# Patient Record
Sex: Male | Born: 1970 | Race: Black or African American | Hispanic: No | Marital: Married | Smoking: Former smoker
Health system: Southern US, Community
[De-identification: ages and names within clinical notes are randomized; demographics above are authoritative.]

## PROBLEM LIST (undated history)

## (undated) DIAGNOSIS — K219 Gastro-esophageal reflux disease without esophagitis: Secondary | ICD-10-CM

## (undated) DIAGNOSIS — T7840XA Allergy, unspecified, initial encounter: Secondary | ICD-10-CM

## (undated) HISTORY — DX: Allergy, unspecified, initial encounter: T78.40XA

## (undated) HISTORY — PX: KNEE SURGERY: SHX244

## (undated) HISTORY — DX: Gastro-esophageal reflux disease without esophagitis: K21.9

---

## 2003-01-21 ENCOUNTER — Emergency Department (HOSPITAL_COMMUNITY): Admission: EM | Admit: 2003-01-21 | Discharge: 2003-01-21 | Payer: Self-pay | Admitting: Emergency Medicine

## 2003-01-21 ENCOUNTER — Encounter: Payer: Self-pay | Admitting: Emergency Medicine

## 2010-05-21 ENCOUNTER — Emergency Department (HOSPITAL_BASED_OUTPATIENT_CLINIC_OR_DEPARTMENT_OTHER): Admission: EM | Admit: 2010-05-21 | Discharge: 2010-05-21 | Payer: Self-pay | Admitting: Emergency Medicine

## 2011-04-24 ENCOUNTER — Emergency Department (HOSPITAL_BASED_OUTPATIENT_CLINIC_OR_DEPARTMENT_OTHER)
Admission: EM | Admit: 2011-04-24 | Discharge: 2011-04-24 | Disposition: A | Discharge status: Home / Self Care | Payer: Self-pay | Attending: Emergency Medicine | Admitting: Emergency Medicine

## 2011-04-24 DIAGNOSIS — H60399 Other infective otitis externa, unspecified ear: Secondary | ICD-10-CM | POA: Insufficient documentation

## 2011-04-24 DIAGNOSIS — J329 Chronic sinusitis, unspecified: Secondary | ICD-10-CM | POA: Insufficient documentation

## 2011-09-01 ENCOUNTER — Emergency Department (HOSPITAL_BASED_OUTPATIENT_CLINIC_OR_DEPARTMENT_OTHER)
Admission: EM | Admit: 2011-09-01 | Discharge: 2011-09-01 | Disposition: A | Discharge status: Home / Self Care | Payer: Self-pay | Attending: Emergency Medicine | Admitting: Emergency Medicine

## 2011-09-01 DIAGNOSIS — F17200 Nicotine dependence, unspecified, uncomplicated: Secondary | ICD-10-CM | POA: Insufficient documentation

## 2011-09-01 DIAGNOSIS — R51 Headache: Secondary | ICD-10-CM | POA: Insufficient documentation

## 2011-09-01 DIAGNOSIS — K029 Dental caries, unspecified: Secondary | ICD-10-CM | POA: Insufficient documentation

## 2011-09-01 MED ORDER — PENICILLIN V POTASSIUM 500 MG PO TABS: 500.0000 mg | ORAL_TABLET | Freq: Four times a day (QID) | ORAL | Status: AC

## 2011-09-01 MED ORDER — HYDROCODONE-ACETAMINOPHEN 5-500 MG PO TABS: 1.0000 | ORAL_TABLET | Freq: Four times a day (QID) | ORAL | Status: AC | PRN

## 2011-09-01 NOTE — ED Provider Notes (Signed)
History     CSN: [id number] Arrival date & time: 09/01/2011  4:49 PM   First MD Initiated Contact with Patient 09/01/11 1720      Chief Complaint  Patient presents with  . Facial Pain    (Consider location/radiation/quality/duration/timing/severity/associated sxs/prior treatment) HPI Comments: Pt had started about one week ago with left side h/a and sinus pain.  Has been taking OTC cold meds, that is better, now with pain to left upper tooth radiatiing to left ear.  No headache now, no neck pain, no sinus pain, no dizziness, no fever  Patient is a 40 y.o. male presenting with tooth pain.  Dental PainPrimary symptoms do not include dental injury, headaches, fever, shortness of breath, sore throat or cough. The symptoms began 2 days ago. The symptoms are worsening. The symptoms are new. The symptoms occur constantly.  Additional symptoms include: gum tenderness and ear pain. Additional symptoms do not include: gum swelling, trismus, facial swelling, trouble swallowing, drooling and fatigue.    History reviewed. No pertinent past medical history.  Past Surgical History  Procedure Date  . Knee surgery     History reviewed. No pertinent family history.  History  Substance Use Topics  . Smoking status: Current Some Day Smoker  . Smokeless tobacco: Not on file  . Alcohol Use: Yes      Review of Systems  Constitutional: Negative for fever, chills, diaphoresis and fatigue.  HENT: Positive for ear pain, congestion and dental problem. Negative for sore throat, facial swelling, rhinorrhea, sneezing, drooling and trouble swallowing.   Eyes: Negative.   Respiratory: Negative for cough, chest tightness and shortness of breath.   Cardiovascular: Negative for chest pain and leg swelling.  Gastrointestinal: Negative for nausea, vomiting, abdominal pain, diarrhea and blood in stool.  Genitourinary: Negative for frequency, hematuria, flank pain and difficulty urinating.  Musculoskeletal:  Negative for back pain and arthralgias.  Skin: Negative for rash.  Neurological: Negative for dizziness, speech difficulty, weakness, numbness and headaches.    Allergies  Codeine  Home Medications   Current Outpatient Rx  Name Route Sig Dispense Refill  . DEXTROMETHORPHAN-GUAIFENESIN 30-600 MG PO TB12 Oral Take 1 tablet by mouth every 12 (twelve) hours.      . OMEGA-3 FATTY ACIDS 1000 MG PO CAPS Oral Take 1 g by mouth daily.      Marland Kitchen VISINE OP Both Eyes Place 1 drop into both eyes daily as needed. Dry eyes     . VITAMIN C 500 MG PO TABS Oral Take 500 mg by mouth daily.      Marland Kitchen HYDROCODONE-ACETAMINOPHEN 5-500 MG PO TABS Oral Take 1-2 tablets by mouth every 6 (six) hours as needed for pain. 15 tablet 0  . PENICILLIN V POTASSIUM 500 MG PO TABS Oral Take 1 tablet (500 mg total) by mouth 4 (four) times daily. 40 tablet 0    BP 116/71  Pulse 81  Temp(Src) 98.4 F (36.9 C) (Oral)  Resp 16  Ht 5\' 9"  (1.753 m)  Wt 175 lb (79.379 kg)  BMI 25.84 kg/m2  SpO2 99%  Physical Exam  Constitutional: He is oriented to person, place, and time. He appears well-developed and well-nourished.  HENT:  Head: Normocephalic and atraumatic.       No pain over sinuses, ears normal.  No trismus or elevation of tongue.  Has point tenderness over left upper 1st molar.  No abscess noted  Eyes: Pupils are equal, round, and reactive to light.  Neck: Normal range of motion. Neck  supple.  Cardiovascular: Normal rate, regular rhythm and normal heart sounds.   Pulmonary/Chest: Effort normal and breath sounds normal. No respiratory distress. He has no wheezes. He has no rales. He exhibits no tenderness.  Abdominal: Soft. Bowel sounds are normal. There is no tenderness. There is no rebound and no guarding.  Musculoskeletal: Normal range of motion. He exhibits no edema.  Lymphadenopathy:    He has no cervical adenopathy.  Neurological: He is alert and oriented to person, place, and time.  Skin: Skin is warm and dry.  No rash noted.  Psychiatric: He has a normal mood and affect.    ED Course  Procedures (including critical care time)  Labs Reviewed - No data to display No results found.   1. Dental caries       MDM  No evidence of dental abscess.  Pain is localized to tooth, doubt sinus infection or other intracranial pathology.  Advised pt to f/u with dentist, return for any worsening symptoms        Rolan Bucco, MD 09/01/11 1756

## 2011-09-01 NOTE — ED Notes (Signed)
Sinus pain, left ear pain, toothache

## 2013-01-05 ENCOUNTER — Emergency Department (HOSPITAL_BASED_OUTPATIENT_CLINIC_OR_DEPARTMENT_OTHER)
Admission: EM | Admit: 2013-01-05 | Discharge: 2013-01-05 | Disposition: A | Discharge status: Home / Self Care | Payer: Self-pay | Attending: Emergency Medicine | Admitting: Emergency Medicine

## 2013-01-05 ENCOUNTER — Encounter (HOSPITAL_BASED_OUTPATIENT_CLINIC_OR_DEPARTMENT_OTHER): Payer: Self-pay | Admitting: *Deleted

## 2013-01-05 DIAGNOSIS — K0889 Other specified disorders of teeth and supporting structures: Secondary | ICD-10-CM

## 2013-01-05 DIAGNOSIS — F17200 Nicotine dependence, unspecified, uncomplicated: Secondary | ICD-10-CM | POA: Insufficient documentation

## 2013-01-05 DIAGNOSIS — R220 Localized swelling, mass and lump, head: Secondary | ICD-10-CM | POA: Insufficient documentation

## 2013-01-05 DIAGNOSIS — K089 Disorder of teeth and supporting structures, unspecified: Secondary | ICD-10-CM | POA: Insufficient documentation

## 2013-01-05 DIAGNOSIS — Z79899 Other long term (current) drug therapy: Secondary | ICD-10-CM | POA: Insufficient documentation

## 2013-01-05 MED ORDER — HYDROCODONE-ACETAMINOPHEN 5-325 MG PO TABS: 2.0000 | ORAL_TABLET | Freq: Four times a day (QID) | ORAL | Status: DC | PRN

## 2013-01-05 MED ORDER — PENICILLIN V POTASSIUM 500 MG PO TABS: 500.0000 mg | ORAL_TABLET | Freq: Three times a day (TID) | ORAL | Status: DC

## 2013-01-05 NOTE — ED Notes (Signed)
Dental abcess top right jaw

## 2013-01-05 NOTE — ED Provider Notes (Signed)
History     CSN: [id number]  Arrival date & time 01/05/13  4098   First MD Initiated Contact with Patient 01/05/13 [phone]      Chief Complaint  Patient presents with  . Dental Pain    (Consider location/radiation/quality/duration/timing/severity/associated sxs/prior treatment) Patient is a 42 y.o. male presenting with tooth pain. The history is provided by the patient.  Dental PainThe primary symptoms include mouth pain. Primary symptoms do not include dental injury. Episode onset: 3-4 days ago. The symptoms are worsening. The symptoms are new. The symptoms occur constantly.  Additional symptoms include: dental sensitivity to temperature, gum swelling and gum tenderness.    History reviewed. No pertinent past medical history.  Past Surgical History  Procedure Laterality Date  . Knee surgery      No family history on file.  History  Substance Use Topics  . Smoking status: Current Some Day Smoker  . Smokeless tobacco: Not on file  . Alcohol Use: Yes      Review of Systems  All other systems reviewed and are negative.    Allergies  Codeine  Home Medications   Current Outpatient Rx  Name  Route  Sig  Dispense  Refill  . dextromethorphan-guaiFENesin (MUCINEX DM) 30-600 MG per 12 hr tablet   Oral   Take 1 tablet by mouth every 12 (twelve) hours.           . fish oil-omega-3 fatty acids 1000 MG capsule   Oral   Take 1 g by mouth daily.           . Tetrahydrozoline HCl (VISINE OP)   Both Eyes   Place 1 drop into both eyes daily as needed. Dry eyes          . vitamin C (ASCORBIC ACID) 500 MG tablet   Oral   Take 500 mg by mouth daily.             BP 113/73  Pulse 70  Temp(Src) 98.4 F (36.9 C) (Oral)  Resp 15  Ht 5\' 10"  (1.778 m)  Wt 175 lb (79.379 kg)  BMI 25.11 kg/m2  SpO2 96%  Physical Exam  Nursing note and vitals reviewed. Constitutional: He is oriented to person, place, and time. He appears well-developed and well-nourished. No  distress.  HENT:  Head: Normocephalic and atraumatic.  Mouth/Throat: Oropharynx is clear and moist.  The right upper molars are noted to have multiple caries and ttp.  There is some gingival inflammation but no obvious abscess.  Neck: Normal range of motion. Neck supple.  Musculoskeletal: Normal range of motion.  Lymphadenopathy:    He has no cervical adenopathy.  Neurological: He is alert and oriented to person, place, and time.  Skin: Skin is warm and dry. He is not diaphoretic.    ED Course  Procedures (including critical care time)  Labs Reviewed - No data to display No results found.   No diagnosis found.    MDM  Antibiotics, pain meds, follow up with dentist.        Geoffery Lyons, MD 01/05/13 [phone]

## 2013-03-24 ENCOUNTER — Emergency Department (HOSPITAL_BASED_OUTPATIENT_CLINIC_OR_DEPARTMENT_OTHER)
Admission: EM | Admit: 2013-03-24 | Discharge: 2013-03-24 | Disposition: A | Discharge status: Home / Self Care | Payer: Self-pay | Attending: Emergency Medicine | Admitting: Emergency Medicine

## 2013-03-24 ENCOUNTER — Encounter (HOSPITAL_BASED_OUTPATIENT_CLINIC_OR_DEPARTMENT_OTHER): Payer: Self-pay | Admitting: *Deleted

## 2013-03-24 DIAGNOSIS — H9209 Otalgia, unspecified ear: Secondary | ICD-10-CM | POA: Insufficient documentation

## 2013-03-24 DIAGNOSIS — R05 Cough: Secondary | ICD-10-CM | POA: Insufficient documentation

## 2013-03-24 DIAGNOSIS — R059 Cough, unspecified: Secondary | ICD-10-CM | POA: Insufficient documentation

## 2013-03-24 DIAGNOSIS — J069 Acute upper respiratory infection, unspecified: Secondary | ICD-10-CM | POA: Insufficient documentation

## 2013-03-24 DIAGNOSIS — J029 Acute pharyngitis, unspecified: Secondary | ICD-10-CM | POA: Insufficient documentation

## 2013-03-24 DIAGNOSIS — J309 Allergic rhinitis, unspecified: Secondary | ICD-10-CM | POA: Insufficient documentation

## 2013-03-24 MED ORDER — CETIRIZINE-PSEUDOEPHEDRINE ER 5-120 MG PO TB12: 1.0000 | ORAL_TABLET | Freq: Two times a day (BID) | ORAL | Status: DC

## 2013-03-24 NOTE — ED Notes (Signed)
Patient states he has a four day history of sinus congestion, sore throat, productive cough with reddish secretions and headache, symptoms are associated with chills and sweating.

## 2013-03-24 NOTE — ED Provider Notes (Signed)
History     CSN: [id number]  Arrival date & time 03/24/13  0865   First MD Initiated Contact with Patient 03/24/13 [phone]      Chief Complaint  Patient presents with  . URI    (Consider location/radiation/quality/duration/timing/severity/associated sxs/prior treatment) HPI Comments: Patient with history of seasonal allergies.  4 day history of sinus congestion, drainage.  She feels as though these are his allergies.  Has been on zyrtec in the past but not improving.  Patient is a 42 y.o. male presenting with URI. The history is provided by the patient.  URI Presenting symptoms: congestion, cough, ear pain, rhinorrhea and sore throat   Severity:  Moderate Onset quality:  Gradual Duration:  4 days Timing:  Constant Progression:  Worsening Chronicity:  New Relieved by:  Nothing Worsened by:  Nothing tried Ineffective treatments:  None tried   History reviewed. No pertinent past medical history.  Past Surgical History  Procedure Laterality Date  . Knee surgery      No family history on file.  History  Substance Use Topics  . Smoking status: Never Smoker   . Smokeless tobacco: Not on file  . Alcohol Use: No      Review of Systems  HENT: Positive for ear pain, congestion, sore throat and rhinorrhea.   Respiratory: Positive for cough.   All other systems reviewed and are negative.    Allergies  Codeine  Home Medications   Current Outpatient Rx  Name  Route  Sig  Dispense  Refill  . dextromethorphan-guaiFENesin (MUCINEX DM) 30-600 MG per 12 hr tablet   Oral   Take 1 tablet by mouth every 12 (twelve) hours.           . fish oil-omega-3 fatty acids 1000 MG capsule   Oral   Take 1 g by mouth daily.           Marland Kitchen HYDROcodone-acetaminophen (NORCO/VICODIN) 5-325 MG per tablet   Oral   Take 2 tablets by mouth every 6 (six) hours as needed for pain.   20 tablet   0   . penicillin v potassium (VEETID) 500 MG tablet   Oral   Take 1 tablet (500 mg total) by  mouth 3 (three) times daily.   30 tablet   0   . Tetrahydrozoline HCl (VISINE OP)   Both Eyes   Place 1 drop into both eyes daily as needed. Dry eyes          . vitamin C (ASCORBIC ACID) 500 MG tablet   Oral   Take 500 mg by mouth daily.             BP 127/88  Pulse 71  Temp(Src) 98.5 F (36.9 C) (Oral)  Resp 16  SpO2 100%  Physical Exam  Nursing note reviewed. Constitutional: He is oriented to person, place, and time. He appears well-developed and well-nourished. No distress.  HENT:  Head: Normocephalic and atraumatic.  Mouth/Throat: Oropharynx is clear and moist.  TM's clear bilaterally.  Neck: Normal range of motion. Neck supple.  Cardiovascular: Normal rate and regular rhythm.   No murmur heard. Pulmonary/Chest: Effort normal and breath sounds normal. No respiratory distress.  Abdominal: Soft. Bowel sounds are normal.  Musculoskeletal: Normal range of motion. He exhibits no edema.  Neurological: He is alert and oriented to person, place, and time.  Skin: Skin is warm and dry. He is not diaphoretic.    ED Course  Procedures (including critical care time)  Labs Reviewed -  No data to display No results found.   No diagnosis found.    MDM  Will treat with zyrtec, time.  Return prn.  Doubt an antibiotic will help as symptoms only present for four days.        Geoffery Lyons, MD 03/24/13 [phone]

## 2013-08-05 ENCOUNTER — Emergency Department (HOSPITAL_BASED_OUTPATIENT_CLINIC_OR_DEPARTMENT_OTHER)
Admission: EM | Admit: 2013-08-05 | Discharge: 2013-08-05 | Disposition: A | Discharge status: Home / Self Care | Payer: Self-pay | Attending: Emergency Medicine | Admitting: Emergency Medicine

## 2013-08-05 ENCOUNTER — Emergency Department (HOSPITAL_BASED_OUTPATIENT_CLINIC_OR_DEPARTMENT_OTHER): Payer: Self-pay

## 2013-08-05 DIAGNOSIS — Z79899 Other long term (current) drug therapy: Secondary | ICD-10-CM | POA: Insufficient documentation

## 2013-08-05 DIAGNOSIS — IMO0001 Reserved for inherently not codable concepts without codable children: Secondary | ICD-10-CM | POA: Insufficient documentation

## 2013-08-05 DIAGNOSIS — Z9889 Other specified postprocedural states: Secondary | ICD-10-CM | POA: Insufficient documentation

## 2013-08-05 DIAGNOSIS — M25569 Pain in unspecified knee: Secondary | ICD-10-CM | POA: Insufficient documentation

## 2013-08-05 DIAGNOSIS — M25561 Pain in right knee: Secondary | ICD-10-CM

## 2013-08-05 MED ORDER — HYDROCODONE-ACETAMINOPHEN 5-325 MG PO TABS: 2.0000 | ORAL_TABLET | ORAL | Status: DC | PRN

## 2013-08-05 MED ORDER — IBUPROFEN 800 MG PO TABS: 800.0000 mg | ORAL_TABLET | Freq: Three times a day (TID) | ORAL | Status: DC

## 2013-08-05 NOTE — ED Notes (Signed)
MD at bedside. 

## 2013-08-05 NOTE — ED Provider Notes (Signed)
CSN: [id number]     Arrival date & time 08/05/13  0865 History   First MD Initiated Contact with Patient 08/05/13 0913     Chief Complaint  Patient presents with  . Knee Pain   (Consider location/radiation/quality/duration/timing/severity/associated sxs/prior Treatment) HPI Comments: Patient complains of constant right-sided knee pain for the past 2 weeks. He denies any injury. He works in a [employer], quantity a lot of boxes. He had surgery on his knee in 2006 for patella fracture. He denies having problems since then. Denies any fevers or vomiting. Denies any focal weakness, numbness or tingling. The pain is diffuse in the knee and does not radiate. It is constant. It is worse with weightbearing and movement. It is better with rest. He took some Tylenol with partial relief.  The history is provided by the patient.    No past medical history on file. Past Surgical History  Procedure Laterality Date  . Knee surgery     No family history on file. History  Substance Use Topics  . Smoking status: Never Smoker   . Smokeless tobacco: Not on file  . Alcohol Use: No    Review of Systems  Constitutional: Negative for fever, activity change and appetite change.  HENT: Negative for rhinorrhea.   Respiratory: Negative for cough, chest tightness and shortness of breath.   Gastrointestinal: Negative for nausea, vomiting and abdominal pain.  Genitourinary: Negative for dysuria and hematuria.  Musculoskeletal: Positive for myalgias and arthralgias. Negative for joint swelling.  Skin: Negative for wound.  Neurological: Negative for dizziness, weakness and headaches.  A complete 10 system review of systems was obtained and all systems are negative except as noted in the HPI and PMH.    Allergies  Codeine  Home Medications   Current Outpatient Rx  Name  Route  Sig  Dispense  Refill  . cetirizine-pseudoephedrine (ZYRTEC-D) 5-120 MG per tablet   Oral   Take 1 tablet by mouth 2 (two) times  daily.   60 tablet   0   . HYDROcodone-acetaminophen (NORCO/VICODIN) 5-325 MG per tablet   Oral   Take 2 tablets by mouth every 4 (four) hours as needed for pain.   10 tablet   0   . ibuprofen (ADVIL,MOTRIN) 800 MG tablet   Oral   Take 1 tablet (800 mg total) by mouth 3 (three) times daily.   21 tablet   0    BP 113/62  Pulse 78  Temp(Src) 97.9 F (36.6 C) (Oral)  Resp 16  Ht 5\' 9"  (1.753 m)  Wt 175 lb (79.379 kg)  BMI 25.83 kg/m2  SpO2 100% Physical Exam  Constitutional: He is oriented to person, place, and time. He appears well-developed and well-nourished. No distress.  HENT:  Head: Normocephalic and atraumatic.  Mouth/Throat: Oropharynx is clear and moist. No oropharyngeal exudate.  Eyes: Conjunctivae and EOM are normal. Pupils are equal, round, and reactive to light.  Neck: Normal range of motion. Neck supple.  Cardiovascular: Normal rate, regular rhythm and normal heart sounds.   No murmur heard. Pulmonary/Chest: Effort normal and breath sounds normal. No respiratory distress.  Abdominal: Soft. There is no tenderness. There is no rebound and no guarding.  Musculoskeletal: Normal range of motion. He exhibits no edema.  R knee has a well-healed surgical scar. There is full range of motion without pain. +2 DP and PT pulse. No calf tenderness. Pain to Palpation of patella and lateral knee. No ligament laxity.  Neurological: He is alert and oriented  to person, place, and time. No cranial nerve deficit. He exhibits normal muscle tone. Coordination normal.  Normal gait  Skin: Skin is warm. No erythema.    ED Course  Procedures (including critical care time) Labs Review Labs Reviewed - No data to display Imaging Review Dg Knee 1-2 Views Right  08/05/2013   CLINICAL DATA:  Pain  EXAM: RIGHT KNEE - 1-2 VIEW  COMPARISON:  None.  FINDINGS: Frontal and lateral views were obtained. There is postoperative change in the patella. There is no acute fracture, dislocation, or  effusion. Joint spaces appear intact. No erosive change.  IMPRESSION: Postoperative change in the patella. No fracture, effusion, or appreciable arthropathy.   Electronically Signed   By: Bretta Bang   On: 08/05/2013 09:44    MDM   1. Knee pain, right    2 weeks of atraumatic knee pain, previous surgery. Vital stable. No distress.  No evidence of septic joint. We'll obtain x-ray to evaluate for hardware failure.  X-rays negative for acute pathology. Patient instructed on anti-inflammatory and Rice therapy. Followup with his orthopedic surgeon. Return precautions discussed.  Glynn Octave, MD 08/05/13 [phone]

## 2013-08-05 NOTE — ED Notes (Signed)
Right knee pain x 2 weeks unrelieved after taking OTC pain medication. Denies injury.

## 2013-08-12 ENCOUNTER — Encounter (HOSPITAL_BASED_OUTPATIENT_CLINIC_OR_DEPARTMENT_OTHER): Payer: Self-pay | Admitting: *Deleted

## 2013-08-12 ENCOUNTER — Emergency Department (HOSPITAL_BASED_OUTPATIENT_CLINIC_OR_DEPARTMENT_OTHER)
Admission: EM | Admit: 2013-08-12 | Discharge: 2013-08-12 | Disposition: A | Discharge status: Home / Self Care | Payer: Self-pay | Attending: Emergency Medicine | Admitting: Emergency Medicine

## 2013-08-12 DIAGNOSIS — Z9889 Other specified postprocedural states: Secondary | ICD-10-CM | POA: Insufficient documentation

## 2013-08-12 DIAGNOSIS — Z791 Long term (current) use of non-steroidal anti-inflammatories (NSAID): Secondary | ICD-10-CM | POA: Insufficient documentation

## 2013-08-12 DIAGNOSIS — M25561 Pain in right knee: Secondary | ICD-10-CM

## 2013-08-12 DIAGNOSIS — Z79899 Other long term (current) drug therapy: Secondary | ICD-10-CM | POA: Insufficient documentation

## 2013-08-12 DIAGNOSIS — M25569 Pain in unspecified knee: Secondary | ICD-10-CM | POA: Insufficient documentation

## 2013-08-12 NOTE — ED Notes (Signed)
Right knee pain x 3 weeks- seen recently for same

## 2013-08-12 NOTE — ED Notes (Signed)
D/c home- worknote given per EDP Delo

## 2013-08-12 NOTE — ED Notes (Signed)
MD at bedside. 

## 2013-08-12 NOTE — ED Provider Notes (Signed)
CSN: [id number]     Arrival date & time 08/12/13  0520 History   First MD Initiated Contact with Patient 08/12/13 0534     Chief Complaint  Patient presents with  . Leg Pain   (Consider location/radiation/quality/duration/timing/severity/associated sxs/prior Treatment) HPI Comments: Patient is a 42 year old male with history of fractured patella 10 years ago which was fixed surgically by an orthopedist in [facility]. Has been doing okay up until the past several weeks when his knee started to hurt again. He states that he has been on his feet at work for extended periods of time but denies any new injury or trauma. He was seen one week ago and had x-rays performed which did not reveal any acute abnormality. He returns today stating that he continues to have discomfort and is not improving.  Patient is a 42 y.o. male presenting with leg pain. The history is provided by the patient.  Leg Pain Location:  Knee Time since incident:  3 weeks Injury: no   Knee location:  R knee Pain details:    Quality:  Aching   Radiates to:  Does not radiate   Severity:  Moderate   Onset quality:  Gradual   Duration:  3 weeks   Timing:  Constant   Progression:  Worsening Chronicity:  New Prior injury to area:  Yes Relieved by:  Nothing Worsened by:  Activity and bearing weight   History reviewed. No pertinent past medical history. Past Surgical History  Procedure Laterality Date  . Knee surgery     No family history on file. History  Substance Use Topics  . Smoking status: Never Smoker   . Smokeless tobacco: Never Used  . Alcohol Use: No    Review of Systems  All other systems reviewed and are negative.    Allergies  Codeine  Home Medications   Current Outpatient Rx  Name  Route  Sig  Dispense  Refill  . cetirizine-pseudoephedrine (ZYRTEC-D) 5-120 MG per tablet   Oral   Take 1 tablet by mouth 2 (two) times daily.   60 tablet   0   . HYDROcodone-acetaminophen (NORCO/VICODIN)  5-325 MG per tablet   Oral   Take 2 tablets by mouth every 4 (four) hours as needed for pain.   10 tablet   0   . ibuprofen (ADVIL,MOTRIN) 800 MG tablet   Oral   Take 1 tablet (800 mg total) by mouth 3 (three) times daily.   21 tablet   0   . HYDROcodone-acetaminophen (NORCO/VICODIN) 5-325 MG per tablet   Oral   Take 2 tablets by mouth every 4 (four) hours as needed for pain.   10 tablet   0   . ibuprofen (ADVIL,MOTRIN) 800 MG tablet   Oral   Take 1 tablet (800 mg total) by mouth 3 (three) times daily.   21 tablet   0    BP 114/61  Pulse 72  Temp(Src) 98.1 F (36.7 C) (Oral)  Resp 16  Ht 5\' 10"  (1.778 m)  Wt 175 lb (79.379 kg)  BMI 25.11 kg/m2  SpO2 98% Physical Exam  Nursing note and vitals reviewed. Constitutional: He is oriented to person, place, and time. He appears well-developed and well-nourished. No distress.  HENT:  Head: Normocephalic and atraumatic.  Neck: Normal range of motion. Neck supple.  Musculoskeletal: Normal range of motion. He exhibits no edema.  The right main is noted to have a prior surgical incision. This appears to be well-healed. There is tenderness  to palpation over the patella and just inferior to the patella. There is no swelling and no effusion. He has good range of motion however there is some discomfort with extension. Anterior and posterior drawer tests are negative and there is no laxity to varus or valgus stress. Distal pulses motor and sensory are intact.  Neurological: He is alert and oriented to person, place, and time.  Skin: Skin is warm and dry. He is not diaphoretic.    ED Course  Procedures (including critical care time) Labs Review Labs Reviewed - No data to display Imaging Review No results found.  MDM  No diagnosis found. I've advised the patient to call his orthopedic surgeon to arrange a followup appointment to discuss why he is having this discomfort. X-rays already been performed and I don't feel the need to  perform these again as there has been no trauma in the interim. I will advise him to continue his anti-inflammatory medications and arrange his followup appointment this morning. I see no redness, effusion or warmth that would suggest a septic joint and see no indication for an arthrocentesis.    Geoffery Lyons, MD 08/12/13 [phone]

## 2014-02-10 ENCOUNTER — Emergency Department (HOSPITAL_BASED_OUTPATIENT_CLINIC_OR_DEPARTMENT_OTHER)
Admission: EM | Admit: 2014-02-10 | Discharge: 2014-02-10 | Disposition: A | Discharge status: Home / Self Care | Payer: Self-pay | Attending: Emergency Medicine | Admitting: Emergency Medicine

## 2014-02-10 ENCOUNTER — Encounter (HOSPITAL_BASED_OUTPATIENT_CLINIC_OR_DEPARTMENT_OTHER): Payer: Self-pay | Admitting: Emergency Medicine

## 2014-02-10 DIAGNOSIS — K089 Disorder of teeth and supporting structures, unspecified: Secondary | ICD-10-CM | POA: Insufficient documentation

## 2014-02-10 DIAGNOSIS — K0889 Other specified disorders of teeth and supporting structures: Secondary | ICD-10-CM | POA: Diagnosis present

## 2014-02-10 DIAGNOSIS — Z79899 Other long term (current) drug therapy: Secondary | ICD-10-CM | POA: Insufficient documentation

## 2014-02-10 DIAGNOSIS — Z791 Long term (current) use of non-steroidal anti-inflammatories (NSAID): Secondary | ICD-10-CM | POA: Insufficient documentation

## 2014-02-10 MED ORDER — PENICILLIN V POTASSIUM 500 MG PO TABS: 500.0000 mg | ORAL_TABLET | Freq: Four times a day (QID) | ORAL | Status: AC

## 2014-02-10 MED ORDER — OXYCODONE-ACETAMINOPHEN 5-325 MG PO TABS: 1.0000 | ORAL_TABLET | Freq: Four times a day (QID) | ORAL | Status: DC | PRN

## 2014-02-10 MED ORDER — OXYCODONE-ACETAMINOPHEN 5-325 MG PO TABS
2.0000 | ORAL_TABLET | Freq: Once | ORAL | Status: AC
  Administered 2014-02-10: 2 via ORAL
  Filled 2014-02-10: qty 2

## 2014-02-10 NOTE — ED Notes (Signed)
MD at bedside. Pt wife is now at bedside.

## 2014-02-10 NOTE — Discharge Instructions (Signed)
Dental Pain °Toothache is pain in or around a tooth. It may get worse with chewing or with cold or heat.  °HOME CARE °· Your dentist may use a numbing medicine during treatment. If so, you may need to avoid eating until the medicine wears off. Ask your dentist about this. °· Only take medicine as told by your dentist or doctor. °· Avoid chewing food near the painful tooth until after all treatment is done. Ask your dentist about this. °GET HELP RIGHT AWAY IF:  °· The problem gets worse or new problems appear. °· You have a fever. °· There is redness and puffiness (swelling) of the face, jaw, or neck. °· You cannot open your mouth. °· There is pain in the jaw. °· There is very bad pain that is not helped by medicine. °MAKE SURE YOU:  °· Understand these instructions. °· Will watch your condition. °· Will get help right away if you are not doing well or get worse. °Document Released: 04/17/2008 Document Revised: 01/22/2012 Document Reviewed: 04/17/2008 °ExitCare® Patient Information ©2014 [employer]. ° ° °Emergency Department Resource Guide °1) Find a Doctor and Pay Out of Pocket °Although you won't have to find out who is covered by your insurance plan, it is a good idea to ask around and get recommendations. You will then need to call the office and see if the doctor you have chosen will accept you as a new patient and what types of options they offer for patients who are self-pay. Some doctors offer discounts or will set up payment plans for their patients who do not have insurance, but you will need to ask so you aren't surprised when you get to your appointment. ° °2) Contact Your Local Health Department °Not all health departments have doctors that can see patients for sick visits, but many do, so it is worth a call to see if yours does. If you don't know where your local health department is, you can check in your phone book. The CDC also has a tool to help you locate your state's health department, and many  state websites also have listings of all of their local health departments. ° °3) Find a Walk-in Clinic °If your illness is not likely to be very severe or complicated, you may want to try a walk in clinic. These are popping up all over the country in pharmacies, drugstores, and shopping centers. They're usually staffed by nurse practitioners or physician assistants that have been trained to treat common illnesses and complaints. They're usually fairly quick and inexpensive. However, if you have serious medical issues or chronic medical problems, these are probably not your best option. ° °No Primary Care Doctor: °- Call [facility] at  832-8000 - they can help you locate a primary care doctor that  accepts your insurance, provides certain services, etc. °- Physician Referral Service- 1-800-533-3463 ° °Chronic Pain Problems: °Organization         Address  Phone   Notes  °[facility]  (336) 297-2271 Patients need to be referred by their primary care doctor.  ° °Medication Assistance: °Organization         Address  Phone   Notes  °[place] [facility] [address] °[place], [place] [address] (336) 641-8030 --Must be a resident of [place] °-- Must have NO insurance coverage whatsoever (no Medicaid/ Medicare, etc.) °-- The pt. MUST have a primary care doctor that directs their care regularly and follows   them in the community °  °[facility]  (866) 331-1348   °[employer]  (888) 892-1162   ° °Agencies that provide inexpensive medical care: °Organization         Address  Phone   Notes  °[facility]  (336) 832-8035   °[facility]    (336) 832-7272   °[facility] [address] °[place], [place] [address] (336) 832-4777   °[facility] [address], °[place] (336) 271-4999   °[facility]    (336) 373-0678   °[facility]    (336) 272-1050   °[facility] ° [address], [place] Phone:  (336) 832-4444, Fax:  (336) 832-4440 Hours of Operation:  9 am - 6 pm, M-F.  Also accepts Medicaid/Medicare and self-pay.  °[facility] ° [address], [place] Phone: (336) 832-3150, Fax: (336) 832-3151. Hours of Operation:  8:30 am - 5:30 pm, M-F.  Also accepts Medicaid and self-pay.  °[facility] [place] [address], [place] Phone: (336) 878-6027   °[facility] [address], [place], [place] (336)723-1848, Ext. 123 Mondays & Thursdays: 7-9 AM.  First 15 patients are seen on a first come, first serve basis. °  ° °Medicaid-accepting [place] Providers: ° °Organization         Address  Phone   Notes  °[facility] [address], [place] (336) 641-2100 Also accepts self-pay patients.  °[facility] [address], [place] ° (336) 856-9996   °[facility] [address], [place] (336) 288-8857   °[facility] [address] [place] [address], [place] (336) 299-7000   °Veita Bland [address], [place]  ° (336) 373-1557 Only accepts [place] Access Medicaid patients after they have their name applied to their card.  ° °Self-Pay (no insurance) in [place]: ° °Organization         Address  Phone   Notes  °Sickle Cell Patients, [facility] [address], [place] (336) 832-1970   °[facility] [address], [place] (336) 832-4400   °[facility] ° [address], [place] (336) 992-4800   °[facility]/Dr. Osei-Bonsu ° [address] [place] [address], [place] or [address], [place] (336) 841-8500 Phone number for both [place] and [place] locations is the same.  °[facility] [address], [place] (336) 299-0000   °[facility] [address]  [place] [address], [place] or [address] (336) 852-7530 °(336) 878-2260   °[facility] [address], [place] (336) 350-1642, phone; (336) 294-5005, fax Sees patients 1st and 3rd Saturday of every month.  Must not qualify for public or private insurance (i.e. Medicaid, Medicare, [place] Health Choice, Veterans' Benefits) • Household income should be no more than 200% of the poverty level •The clinic cannot treat you if you are pregnant or think you are pregnant • Sexually transmitted diseases are not treated at the clinic.  ° ° °Dental Care: °Organization         Address  Phone  Notes  °[facility] [address],  (336) 641-6152 Accepts children up to age 21 who are enrolled   in Medicaid or [place] Health Choice; pregnant women with a Medicaid card; and children who have applied for Medicaid or [place] Health Choice, but were declined, whose parents can pay a reduced fee at time of service.  °[facility] [place]  [address], [place] (336) 641-7733 Accepts children up to age 21 who are enrolled in Medicaid or [place] Health Choice; pregnant women with a Medicaid card; and children who have applied for Medicaid or [place] Health Choice, but were declined, whose parents can pay a reduced fee at time of service.  °[facility] ° [address], [place] (336) 641-4533 Patients are seen by appointment only. Walk-ins are not accepted. [facility] will see patients 18 years of age and older. °Monday - Tuesday (8am-5pm) °Most Wednesdays (8:30-5pm) °$30 per visit, cash only  °[facility] ° [address], [place] (336) 641-4533 Patients are seen by appointment only. Walk-ins are not accepted. [facility] will see patients 18 years of age and older. °One Wednesday Evening (Monthly: Volunteer Based).  $30 per visit, cash only  °[facility]  (919) 537-3737 for adults; Children under age 4, call Graduate Pediatric Dentistry at (919) [phone]. Children aged 4-14, please call (919) [phone] to request a pediatric application. ° Dental services are provided in all areas of dental care including fillings, crowns and bridges, complete and partial dentures, implants, gum treatment, root canals, and extractions. Preventive care is also provided. Treatment is provided to both adults and children. °Patients are selected via a lottery and there is often a waiting list. °  °[facility] [address], °[place] ° (336) 763-8833 [email or web address] °  °[facility] [address], [place], [place] (336)723-1848, Ext. 123 Second and Fourth Thursday of each month, opens at 6:30 AM; Clinic ends at 9 AM.  Patients are seen on a first-come first-served basis, and a limited number are seen during each clinic.  ° °[facility] ° [address], [place], [place] (336) 723-7904   Eligibility Requirements °You must have lived in [place], [place], or [place] counties for at least the last three months. °  You cannot be eligible for state or federal sponsored healthcare insurance, including Veterans Administration, Medicaid, or Medicare. °  You generally cannot be eligible for healthcare insurance through your employer.  °  How to apply: °Eligibility screenings are held every Tuesday and Wednesday afternoon from 1:00 pm until 4:00 pm. You do not need an appointment for the interview!  °[facility] [address], [place], [place] 336-631-2330   °[facility]  336-342-8273   °[facility]  336-703-3100   °[facility]  336-570-6415   ° °Behavioral Health Resources in the Community: °Intensive Outpatient Programs °Organization         Address  Phone  Notes  °[facility] [address], [place], [place]  336-878-6098   °[facility] [address], [place], [place] 336-832-9800   °[facility] [address], [place], [place] ° 336-882-2125   °[facility] [address],  °[place], [place] 1-800-853-5163 or 336-641-4981   °Substance Abuse Resources °Organization         Address  Phone  Notes  °[facility]  336-882-2125   °[facility]  336-784-9470   °  [facility]  336-285-9073   °[facility]  336-845-3988   °Residential & Outpatient Substance Abuse Program  1-800-659-3381   °Psychological Services °Organization         Address  Phone  Notes  °[facility]  336- 832-9600   °[facility]  336- 378-7881   °[facility] [address], [place] 1-800-853-5163 or 336-641-4981   ° °Mobile Crisis Teams °Organization         Address  Phone  Notes  °[facility], Mobile Crisis Care Unit  1-877-626-1772   °Assertive °[facility] ° [address] [place], [place] 336-834-9664   °Sharon DeEsch [address] °[place] 336-554-5454   ° °Self-Help/Support Groups °Organization         Address  Phone             Notes  °[facility] - variety of support groups  336- 373-1402 Call for more information  °Narcotics Anonymous (NA), [facility] [address], °[place]  2 meetings at this location  ° °Residential Treatment Programs °Organization         Address  Phone  Notes  °[facility] [address],    °[place]  1-866-801-8205   °[facility] ° [address], [place], [place] 704-293-8524   °[facility] [address], [place] 336-845-3988 Admissions: 8am-3pm M-F  °[facility] [address]    °[place], [place] 336-841-1104   °[facility] [address], [place], [place] 336-379-7146   °[facility] [address]    °[place], [place] 336-285-9073   °[facility] - Intensive Outpatient [address], [place], [place] 336-852-3033   °[facility] [address]  °[place], [place] 1-877-615-2722 or 336-784-9470   °[facility] [address] [place], [place] 336-227-7417 Accepts Medicaid  °[facility] [address]  ° [place] 1-800-659-3381 Substance Abuse/Addiction Treatment  ° °[place] Behavioral Health Resources °Organization         Address  Phone  Notes  °[facility]  (888) 581-9988   °Julie Brannon, PhD [address] [place], [place]   (336) 349-5553 or (336) 951-0000   °[facility]   [address] °[place], [place] (336) 349-4454   °[facility] [address], [place], [place] (336) 342-8316 Insurance/Medicaid/sponsorship through [facility]  °[facility] [address]                                    [place], [place] (336) 342-8316 Therapy/tele-psych/case  °[facility] [address]  ° [place], [place] (336) 349-2233    °Dr. Arfeen  (336) 349-4544   °[facility] [place]  [employer] [place] [facility] 1) [address],  °2) [address], [place] °3)  [address], [place] (336) 349-3220 °(336) 342-7768 ° °(336) 342-8140   °[place] [facility] (336) 342-1394 or (336) 342-3537 (After Hours)    ° ° ° °

## 2014-02-10 NOTE — ED Notes (Signed)
Pt amb to room 9 with quick steady gait, reports biting into an apple this morning and broke off tooth to right upper area.

## 2014-02-10 NOTE — ED Provider Notes (Signed)
CSN: [id number]     Arrival date & time 02/10/14  4098 History   First MD Initiated Contact with Patient 02/10/14 [phone]     Chief Complaint  Patient presents with  . Dental Pain     (Consider location/radiation/quality/duration/timing/severity/associated sxs/prior Treatment) Patient is a 43 y.o. male presenting with tooth pain. The history is provided by the patient.  Dental Pain Location:  Upper Upper teeth location:  4/RU 2nd bicuspid Quality:  Aching Severity:  Severe Onset quality:  Sudden Duration:  2 hours Timing:  Constant Progression:  Unchanged Chronicity:  Recurrent Context: dental fracture   Previous work-up:  Dental exam and filled cavity Relieved by:  Nothing Worsened by:  Nothing tried Ineffective treatments:  None tried Associated symptoms: no drooling, no fever, no headaches and no neck pain     History reviewed. No pertinent past medical history. Past Surgical History  Procedure Laterality Date  . Knee surgery     History reviewed. No pertinent family history. History  Substance Use Topics  . Smoking status: Never Smoker   . Smokeless tobacco: Never Used  . Alcohol Use: No    Review of Systems  Constitutional: Negative for fever.  HENT: Negative for drooling and rhinorrhea.   Eyes: Negative for pain.  Respiratory: Negative for cough and shortness of breath.   Cardiovascular: Negative for chest pain and leg swelling.  Gastrointestinal: Negative for nausea, vomiting, abdominal pain and diarrhea.  Genitourinary: Negative for dysuria and hematuria.  Musculoskeletal: Negative for gait problem and neck pain.  Skin: Negative for color change.  Neurological: Negative for numbness and headaches.  Hematological: Negative for adenopathy.  Psychiatric/Behavioral: Negative for behavioral problems.  All other systems reviewed and are negative.      Allergies  Codeine  Home Medications   Current Outpatient Rx  Name  Route  Sig  Dispense  Refill  .  cetirizine-pseudoephedrine (ZYRTEC-D) 5-120 MG per tablet   Oral   Take 1 tablet by mouth 2 (two) times daily.   60 tablet   0   . HYDROcodone-acetaminophen (NORCO/VICODIN) 5-325 MG per tablet   Oral   Take 2 tablets by mouth every 4 (four) hours as needed for pain.   10 tablet   0   . HYDROcodone-acetaminophen (NORCO/VICODIN) 5-325 MG per tablet   Oral   Take 2 tablets by mouth every 4 (four) hours as needed for pain.   10 tablet   0   . ibuprofen (ADVIL,MOTRIN) 800 MG tablet   Oral   Take 1 tablet (800 mg total) by mouth 3 (three) times daily.   21 tablet   0   . ibuprofen (ADVIL,MOTRIN) 800 MG tablet   Oral   Take 1 tablet (800 mg total) by mouth 3 (three) times daily.   21 tablet   0    BP 128/88  Pulse 81  Temp(Src) 98.2 F (36.8 C) (Oral)  Resp 18  Ht 5\' 10"  (1.778 m)  Wt 175 lb (79.379 kg)  BMI 25.11 kg/m2  SpO2 96% Physical Exam  Nursing note and vitals reviewed. Constitutional: He is oriented to person, place, and time. He appears well-developed and well-nourished.  HENT:  Head: Normocephalic and atraumatic.  Right Ear: External ear normal.  Left Ear: External ear normal.  Nose: Nose normal.  Mouth/Throat: Oropharynx is clear and moist. No oropharyngeal exudate.    No trismus.   Normal appearing posterior oropharynx.  No intraoral or periapical abscess noted.  Normal range of motion of the neck.  Eyes: Conjunctivae and EOM are normal. Pupils are equal, round, and reactive to light.  Neck: Normal range of motion. Neck supple.  Cardiovascular: Normal rate, regular rhythm, normal heart sounds and intact distal pulses.  Exam reveals no gallop and no friction rub.   No murmur heard. Pulmonary/Chest: Effort normal and breath sounds normal. No respiratory distress. He has no wheezes.  Abdominal: Soft. Bowel sounds are normal. He exhibits no distension. There is no tenderness. There is no rebound and no guarding.  Musculoskeletal: Normal range of  motion. He exhibits no edema and no tenderness.  Neurological: He is alert and oriented to person, place, and time.  Skin: Skin is warm and dry.  Psychiatric: He has a normal mood and affect. His behavior is normal.    ED Course  Procedures (including critical care time) Labs Review Labs Reviewed - No data to display Imaging Review No results found.   EKG Interpretation None      MDM   Final diagnoses:  Pain, dental    9:01 AM 43 y.o. male chronic dental pain who presents with acute worsening after fighting an apple at approximately 7 AM this morning at work. He believes he may have worsened an already known fractured right upper second molar. He is afebrile and vital signs are unremarkable here. Will give Percocet do to severe pain and also a local anesthetic. The tooth appears to have a necrotic pulp and I'm unsure whether he has an acute fracture to the tooth.   I applied local anesthetic (1% lido) to the apex of the tooth. I also applied dycal to protect the tooth despite a necrotic appearing pulp and also to help w/ sensitivity.  I have discussed the diagnosis/risks/treatment options with the patient and believe the pt to be eligible for discharge home to follow-up with a dentist asap. We also discussed returning to the ED immediately if new or worsening sx occur. We discussed the sx which are most concerning (e.g., worsening pain, fever, concern for abscess) that necessitate immediate return. Medications administered to the patient during their visit and any new prescriptions provided to the patient are listed below.  Medications given during this visit Medications  oxyCODONE-acetaminophen (PERCOCET/ROXICET) 5-325 MG per tablet 2 tablet (2 tablets Oral Given 02/10/14 0903)    Discharge Medication List as of 02/10/2014 10:03 AM    START taking these medications   Details  oxyCODONE-acetaminophen (PERCOCET) 5-325 MG per tablet Take 1-2 tablets by mouth every 6 (six) hours as  needed for moderate pain., Starting 02/10/2014, Until Discontinued, Print    penicillin v potassium (VEETID) 500 MG tablet Take 1 tablet (500 mg total) by mouth 4 (four) times daily., Starting 02/10/2014, Last dose on Tue 02/17/14, Print           Junius ArgyleForrest S Leoncio Hansen, MD 02/11/14 1145

## 2014-02-10 NOTE — ED Notes (Signed)
Dental box placed at bedside. Pt assisted to call his wife, who states she will pick him up and drive him home.

## 2014-02-10 NOTE — ED Notes (Signed)
Supplies gathered and placed at bedside for dr. Romeo AppleHarrison.

## 2014-10-16 ENCOUNTER — Emergency Department (HOSPITAL_BASED_OUTPATIENT_CLINIC_OR_DEPARTMENT_OTHER)
Admission: EM | Admit: 2014-10-16 | Discharge: 2014-10-16 | Disposition: A | Discharge status: Home / Self Care | Payer: Managed Care, Other (non HMO) | Attending: Emergency Medicine | Admitting: Emergency Medicine

## 2014-10-16 ENCOUNTER — Encounter (HOSPITAL_BASED_OUTPATIENT_CLINIC_OR_DEPARTMENT_OTHER): Payer: Self-pay

## 2014-10-16 DIAGNOSIS — Z79899 Other long term (current) drug therapy: Secondary | ICD-10-CM | POA: Diagnosis not present

## 2014-10-16 DIAGNOSIS — B349 Viral infection, unspecified: Secondary | ICD-10-CM | POA: Insufficient documentation

## 2014-10-16 DIAGNOSIS — R109 Unspecified abdominal pain: Secondary | ICD-10-CM | POA: Diagnosis present

## 2014-10-16 NOTE — ED Notes (Signed)
Pt reports diffuse abdominal pain. Sts 1 episode of vomiting and some diarrhea. Reports feeling "weak."

## 2014-10-16 NOTE — Discharge Instructions (Signed)

## 2014-10-16 NOTE — ED Provider Notes (Signed)
CSN: [id number]     Arrival date & time 10/16/14  0845 History   First MD Initiated Contact with Patient 10/16/14 0901     Chief Complaint  Patient presents with  . Abdominal Pain     (Consider location/radiation/quality/duration/timing/severity/associated sxs/prior Treatment) HPI Comments: PT presents with a 2 day hx of some mild rhinorrhea, cough and myalgia.  No fevers.  No SOB.  Has had one episode of vomiting.  Has had some intermittent abdominal cramping with some watery diarrhea. He had 4 episodes of diarrhea yesterday. He feels fatigued and weak all over. He is taking Mucinex and he feels like his congestion and coughing as much better with the Mucinex.  Patient is a 43 y.o. male presenting with abdominal pain.  Abdominal Pain Associated symptoms: cough, diarrhea, nausea and vomiting   Associated symptoms: no chest pain, no chills, no fatigue, no fever, no hematuria and no shortness of breath     History reviewed. No pertinent past medical history. Past Surgical History  Procedure Laterality Date  . Knee surgery     No family history on file. History  Substance Use Topics  . Smoking status: Never Smoker   . Smokeless tobacco: Never Used  . Alcohol Use: No    Review of Systems  Constitutional: Negative for fever, chills, diaphoresis and fatigue.  HENT: Positive for congestion. Negative for rhinorrhea and sneezing.   Eyes: Negative.   Respiratory: Positive for cough. Negative for chest tightness and shortness of breath.   Cardiovascular: Negative for chest pain and leg swelling.  Gastrointestinal: Positive for nausea, vomiting, abdominal pain and diarrhea. Negative for blood in stool.  Genitourinary: Negative for frequency, hematuria, flank pain and difficulty urinating.  Musculoskeletal: Negative for back pain and arthralgias.  Skin: Negative for rash.  Neurological: Negative for dizziness, speech difficulty, weakness, numbness and headaches.      Allergies   Codeine  Home Medications   Prior to Admission medications   Medication Sig Start Date End Date Taking? Authorizing Provider  cetirizine-pseudoephedrine (ZYRTEC-D) 5-120 MG per tablet Take 1 tablet by mouth 2 (two) times daily. 03/24/13   Geoffery Lyonsouglas Delo, MD  HYDROcodone-acetaminophen (NORCO/VICODIN) 5-325 MG per tablet Take 2 tablets by mouth every 4 (four) hours as needed for pain. 08/05/13   Glynn OctaveStephen Rancour, MD  HYDROcodone-acetaminophen (NORCO/VICODIN) 5-325 MG per tablet Take 2 tablets by mouth every 4 (four) hours as needed for pain. 08/05/13   Glynn OctaveStephen Rancour, MD  ibuprofen (ADVIL,MOTRIN) 800 MG tablet Take 1 tablet (800 mg total) by mouth 3 (three) times daily. 08/05/13   Glynn OctaveStephen Rancour, MD  ibuprofen (ADVIL,MOTRIN) 800 MG tablet Take 1 tablet (800 mg total) by mouth 3 (three) times daily. 08/05/13   Glynn OctaveStephen Rancour, MD  oxyCODONE-acetaminophen (PERCOCET) 5-325 MG per tablet Take 1-2 tablets by mouth every 6 (six) hours as needed for moderate pain. 02/10/14   Purvis SheffieldForrest Harrison, MD   BP 130/80 mmHg  Pulse 94  Temp(Src) 98 F (36.7 C) (Oral)  Resp 16  Ht 5\' 10"  (1.778 m)  Wt 175 lb (79.379 kg)  BMI 25.11 kg/m2  SpO2 98% Physical Exam  Constitutional: He is oriented to person, place, and time. He appears well-developed and well-nourished.  HENT:  Head: Normocephalic and atraumatic.  Right Ear: External ear normal.  Left Ear: External ear normal.  Mouth/Throat: Oropharynx is clear and moist.  Eyes: Pupils are equal, round, and reactive to light.  Neck: Normal range of motion. Neck supple.  Cardiovascular: Normal rate, regular rhythm and normal  heart sounds.   Pulmonary/Chest: Effort normal and breath sounds normal. No respiratory distress. He has no wheezes. He has no rales. He exhibits no tenderness.  Abdominal: Soft. Bowel sounds are normal. There is no tenderness. There is no rebound and no guarding.  Musculoskeletal: Normal range of motion. He exhibits no edema.  Lymphadenopathy:     He has no cervical adenopathy.  Neurological: He is alert and oriented to person, place, and time.  Skin: Skin is warm and dry. No rash noted.  Psychiatric: He has a normal mood and affect.    ED Course  Procedures (including critical care time) Labs Review Labs Reviewed - No data to display  Imaging Review No results found.   EKG Interpretation None      MDM   Final diagnoses:  Viral syndrome    Patient is well-appearing and his vital signs are unremarkable. His abdominal exam is completely nontender. I feel that his symptoms are consistent with a viral syndrome. He was instructed in symptomatic care. He is given a work note. He was advised to return if his symptoms worsen.    Rolan BuccoMelanie Haru Shaff, MD 10/16/14 [phone]

## 2015-10-13 ENCOUNTER — Emergency Department (HOSPITAL_BASED_OUTPATIENT_CLINIC_OR_DEPARTMENT_OTHER)
Admission: EM | Admit: 2015-10-13 | Discharge: 2015-10-13 | Disposition: A | Discharge status: Home / Self Care | Payer: Managed Care, Other (non HMO) | Attending: Emergency Medicine | Admitting: Emergency Medicine

## 2015-10-13 ENCOUNTER — Encounter (HOSPITAL_BASED_OUTPATIENT_CLINIC_OR_DEPARTMENT_OTHER): Payer: Self-pay | Admitting: *Deleted

## 2015-10-13 DIAGNOSIS — J069 Acute upper respiratory infection, unspecified: Secondary | ICD-10-CM | POA: Diagnosis not present

## 2015-10-13 DIAGNOSIS — Z791 Long term (current) use of non-steroidal anti-inflammatories (NSAID): Secondary | ICD-10-CM | POA: Diagnosis not present

## 2015-10-13 DIAGNOSIS — Z79899 Other long term (current) drug therapy: Secondary | ICD-10-CM | POA: Insufficient documentation

## 2015-10-13 DIAGNOSIS — R05 Cough: Secondary | ICD-10-CM | POA: Diagnosis present

## 2015-10-13 MED ORDER — BENZONATATE 100 MG PO CAPS: 100.0000 mg | ORAL_CAPSULE | Freq: Three times a day (TID) | ORAL | Status: DC

## 2015-10-13 NOTE — ED Provider Notes (Signed)
CSN: [id number]     Arrival date & time 10/13/15  1355 History   First MD Initiated Contact with Patient 10/13/15 1546     Chief Complaint  Patient presents with  . Cough     (Consider location/radiation/quality/duration/timing/severity/associated sxs/prior Treatment) HPI Comments: Patient is 44 year old male who presents with complaints of chest congestion and cough for the past 3 days. He denies any fevers or chills but does report feeling achy all over. He reports that his wife was sick in a similar fashion last week.  Patient is a 44 y.o. male presenting with cough. The history is provided by the patient.  Cough Cough characteristics:  Productive Severity:  Moderate Onset quality:  Sudden Duration:  3 days Timing:  Constant Progression:  Worsening Chronicity:  New Relieved by:  Nothing Worsened by:  Nothing tried Ineffective treatments:  None tried   History reviewed. No pertinent past medical history. Past Surgical History  Procedure Laterality Date  . Knee surgery     No family history on file. Social History  Substance Use Topics  . Smoking status: Never Smoker   . Smokeless tobacco: Never Used  . Alcohol Use: Yes     Comment: occasional    Review of Systems  Respiratory: Positive for cough.   All other systems reviewed and are negative.     Allergies  Codeine  Home Medications   Prior to Admission medications   Medication Sig Start Date End Date Taking? Authorizing Provider  cetirizine-pseudoephedrine (ZYRTEC-D) 5-120 MG per tablet Take 1 tablet by mouth 2 (two) times daily. 03/24/13   Geoffery Lyonsouglas Keli Buehner, MD  HYDROcodone-acetaminophen (NORCO/VICODIN) 5-325 MG per tablet Take 2 tablets by mouth every 4 (four) hours as needed for pain. 08/05/13   Glynn OctaveStephen Rancour, MD  HYDROcodone-acetaminophen (NORCO/VICODIN) 5-325 MG per tablet Take 2 tablets by mouth every 4 (four) hours as needed for pain. 08/05/13   Glynn OctaveStephen Rancour, MD  ibuprofen (ADVIL,MOTRIN) 800 MG tablet  Take 1 tablet (800 mg total) by mouth 3 (three) times daily. 08/05/13   Glynn OctaveStephen Rancour, MD  ibuprofen (ADVIL,MOTRIN) 800 MG tablet Take 1 tablet (800 mg total) by mouth 3 (three) times daily. 08/05/13   Glynn OctaveStephen Rancour, MD  oxyCODONE-acetaminophen (PERCOCET) 5-325 MG per tablet Take 1-2 tablets by mouth every 6 (six) hours as needed for moderate pain. 02/10/14   Purvis SheffieldForrest Harrison, MD   BP 114/83 mmHg  Pulse 78  Temp(Src) 98.4 F (36.9 C) (Oral)  Resp 18  Ht 5\' 10"  (1.778 m)  Wt 175 lb (79.379 kg)  BMI 25.11 kg/m2  SpO2 99% Physical Exam  Constitutional: He is oriented to person, place, and time. He appears well-developed and well-nourished. No distress.  HENT:  Head: Normocephalic and atraumatic.  Mouth/Throat: Oropharynx is clear and moist.  Neck: Normal range of motion. Neck supple.  Cardiovascular: Normal rate, regular rhythm and normal heart sounds.   No murmur heard. Pulmonary/Chest: Effort normal and breath sounds normal. No respiratory distress. He has no wheezes. He has no rales.  Abdominal: Soft. Bowel sounds are normal. He exhibits no distension. There is no tenderness.  Musculoskeletal: Normal range of motion. He exhibits no edema.  Lymphadenopathy:    He has no cervical adenopathy.  Neurological: He is alert and oriented to person, place, and time.  Skin: Skin is warm and dry. He is not diaphoretic.  Nursing note and vitals reviewed.   ED Course  Procedures (including critical care time) Labs Review Labs Reviewed - No data to display  Imaging  Review No results found. I have personally reviewed and evaluated these images and lab results as part of my medical decision-making.   EKG Interpretation None      MDM   Final diagnoses:  None    Patient with chest congestion and cough for the past 3 days. His lungs are clear, there is no hypoxia, and he is in no respiratory distress. He is nontoxic appearing. I highly suspect this is a viral illness. I will prescribe  Robitussin with codeine he can take as needed for cough at night, over-the-counter medications as needed, and when necessary return if he worsens.    Geoffery Lyons, MD 10/13/15 [phone]

## 2015-10-13 NOTE — ED Notes (Signed)
Pt reports cough prod for reddish-yellow mucous, body aches, fatigue since yesterday

## 2015-10-13 NOTE — Discharge Instructions (Signed)
Tessalon as prescribed as needed for cough.  Over-the-counter medications as needed for symptomatically relief.  Return to the emergency room if you develop chest pain or difficulty breathing, or other new and concerning symptoms.   Upper Respiratory Infection, Adult Most upper respiratory infections (URIs) are a viral infection of the air passages leading to the lungs. A URI affects the nose, throat, and upper air passages. The most common type of URI is nasopharyngitis and is typically referred to as "the common cold." URIs run their course and usually go away on their own. Most of the time, a URI does not require medical attention, but sometimes a bacterial infection in the upper airways can follow a viral infection. This is called a secondary infection. Sinus and middle ear infections are common types of secondary upper respiratory infections. Bacterial pneumonia can also complicate a URI. A URI can worsen asthma and chronic obstructive pulmonary disease (COPD). Sometimes, these complications can require emergency medical care and may be life threatening.  CAUSES Almost all URIs are caused by viruses. A virus is a type of germ and can spread from one person to another.  RISKS FACTORS You may be at risk for a URI if:   You smoke.   You have chronic heart or lung disease.  You have a weakened defense (immune) system.   You are very young or very old.   You have nasal allergies or asthma.  You work in crowded or poorly ventilated areas.  You work in health care facilities or schools. SIGNS AND SYMPTOMS  Symptoms typically develop 2-3 days after you come in contact with a cold virus. Most viral URIs last 7-10 days. However, viral URIs from the influenza virus (flu virus) can last 14-18 days and are typically more severe. Symptoms may include:   Runny or stuffy (congested) nose.   Sneezing.   Cough.   Sore throat.   Headache.   Fatigue.   Fever.   Loss of  appetite.   Pain in your forehead, behind your eyes, and over your cheekbones (sinus pain).  Muscle aches.  DIAGNOSIS  Your health care provider may diagnose a URI by:  Physical exam.  Tests to check that your symptoms are not due to another condition such as:  Strep throat.  Sinusitis.  Pneumonia.  Asthma. TREATMENT  A URI goes away on its own with time. It cannot be cured with medicines, but medicines may be prescribed or recommended to relieve symptoms. Medicines may help:  Reduce your fever.  Reduce your cough.  Relieve nasal congestion. HOME CARE INSTRUCTIONS   Take medicines only as directed by your health care provider.   Gargle warm saltwater or take cough drops to comfort your throat as directed by your health care provider.  Use a warm mist humidifier or inhale steam from a shower to increase air moisture. This may make it easier to breathe.  Drink enough fluid to keep your urine clear or pale yellow.   Eat soups and other clear broths and maintain good nutrition.   Rest as needed.   Return to work when your temperature has returned to normal or as your health care provider advises. You may need to stay home longer to avoid infecting others. You can also use a face mask and careful hand washing to prevent spread of the virus.  Increase the usage of your inhaler if you have asthma.   Do not use any tobacco products, including cigarettes, chewing tobacco, or electronic cigarettes. If  you need help quitting, ask your health care provider. PREVENTION  The best way to protect yourself from getting a cold is to practice good hygiene.   Avoid oral or hand contact with people with cold symptoms.   Wash your hands often if contact occurs.  There is no clear evidence that vitamin C, vitamin E, echinacea, or exercise reduces the chance of developing a cold. However, it is always recommended to get plenty of rest, exercise, and practice good nutrition.    SEEK MEDICAL CARE IF:   You are getting worse rather than better.   Your symptoms are not controlled by medicine.   You have chills.  You have worsening shortness of breath.  You have brown or red mucus.  You have yellow or brown nasal discharge.  You have pain in your face, especially when you bend forward.  You have a fever.  You have swollen neck glands.  You have pain while swallowing.  You have white areas in the back of your throat. SEEK IMMEDIATE MEDICAL CARE IF:   You have severe or persistent:  Headache.  Ear pain.  Sinus pain.  Chest pain.  You have chronic lung disease and any of the following:  Wheezing.  Prolonged cough.  Coughing up blood.  A change in your usual mucus.  You have a stiff neck.  You have changes in your:  Vision.  Hearing.  Thinking.  Mood. MAKE SURE YOU:   Understand these instructions.  Will watch your condition.  Will get help right away if you are not doing well or get worse.   This information is not intended to replace advice given to you by your health care provider. Make sure you discuss any questions you have with your health care provider.   Document Released: 04/25/2001 Document Revised: 03/16/2015 Document Reviewed: 02/04/2014 Elsevier Interactive Patient Education [employer].

## 2015-10-16 ENCOUNTER — Emergency Department (HOSPITAL_BASED_OUTPATIENT_CLINIC_OR_DEPARTMENT_OTHER)
Admission: EM | Admit: 2015-10-16 | Discharge: 2015-10-16 | Disposition: A | Discharge status: Home / Self Care | Payer: Managed Care, Other (non HMO) | Attending: Emergency Medicine | Admitting: Emergency Medicine

## 2015-10-16 ENCOUNTER — Encounter (HOSPITAL_BASED_OUTPATIENT_CLINIC_OR_DEPARTMENT_OTHER): Payer: Self-pay | Admitting: *Deleted

## 2015-10-16 DIAGNOSIS — J069 Acute upper respiratory infection, unspecified: Secondary | ICD-10-CM

## 2015-10-16 DIAGNOSIS — Z79899 Other long term (current) drug therapy: Secondary | ICD-10-CM | POA: Diagnosis not present

## 2015-10-16 DIAGNOSIS — R05 Cough: Secondary | ICD-10-CM | POA: Diagnosis present

## 2015-10-16 MED ORDER — ALBUTEROL SULFATE HFA 108 (90 BASE) MCG/ACT IN AERS
2.0000 | INHALATION_SPRAY | RESPIRATORY_TRACT | Status: DC | PRN
  Administered 2015-10-16: 2 via RESPIRATORY_TRACT
  Filled 2015-10-16: qty 6.7

## 2015-10-16 NOTE — ED Notes (Signed)
Patient states he was here on 11/30 for a cough, but it is not any better, productive cough, fatigue, hard to sleep due to coughing

## 2015-10-16 NOTE — ED Provider Notes (Addendum)
CSN: [id number]     Arrival date & time 10/16/15  0805 History   First MD Initiated Contact with Patient 10/16/15 [phone]     Chief Complaint  Patient presents with  . Cough     (Consider location/radiation/quality/duration/timing/severity/associated sxs/prior Treatment) HPI  56 male who comes in today complaining of upper respiratory congestion with cough for the past 4 days. He has not had fever or chills. He reports he has had diffuse muscle aches. He reports that his wife has had similar symptoms. He has had cough with some discolored sputum. He has also had some nasal congestion with discolored sputum. He does not feel short of breath. He states he has been coughing at night and unable to get good rest despite the medication he has been taking previously. He occasionally smokes blackened hands but states he is not a regular smoker. He has no history of asthma, wheezing, or respiratory difficulties. Any history of blood clots in his legs or lungs. He is not having chest pain. He has been eating and drinking without difficulty.  History reviewed. No pertinent past medical history. Past Surgical History  Procedure Laterality Date  . Knee surgery     No family history on file. Social History  Substance Use Topics  . Smoking status: Never Smoker   . Smokeless tobacco: Never Used  . Alcohol Use: Yes     Comment: occasional    Review of Systems  All other systems reviewed and are negative.     Allergies  Codeine  Home Medications   Prior to Admission medications   Medication Sig Start Date End Date Taking? Authorizing Provider  benzonatate (TESSALON) 100 MG capsule Take 1 capsule (100 mg total) by mouth every 8 (eight) hours. 10/13/15   Geoffery Lyons, MD  cetirizine-pseudoephedrine (ZYRTEC-D) 5-120 MG per tablet Take 1 tablet by mouth 2 (two) times daily. 03/24/13   Geoffery Lyons, MD  HYDROcodone-acetaminophen (NORCO/VICODIN) 5-325 MG per tablet Take 2 tablets by mouth every 4  (four) hours as needed for pain. 08/05/13   Glynn Octave, MD  HYDROcodone-acetaminophen (NORCO/VICODIN) 5-325 MG per tablet Take 2 tablets by mouth every 4 (four) hours as needed for pain. 08/05/13   Glynn Octave, MD  ibuprofen (ADVIL,MOTRIN) 800 MG tablet Take 1 tablet (800 mg total) by mouth 3 (three) times daily. 08/05/13   Glynn Octave, MD  ibuprofen (ADVIL,MOTRIN) 800 MG tablet Take 1 tablet (800 mg total) by mouth 3 (three) times daily. 08/05/13   Glynn Octave, MD  oxyCODONE-acetaminophen (PERCOCET) 5-325 MG per tablet Take 1-2 tablets by mouth every 6 (six) hours as needed for moderate pain. 02/10/14   Purvis Sheffield, MD   BP 120/82 mmHg  Pulse 78  Temp(Src) 98.4 F (36.9 C) (Oral)  Resp 18  Ht 5' 9.5" (1.765 m)  Wt 79.379 kg  BMI 25.48 kg/m2  SpO2 100% Physical Exam  Constitutional: He is oriented to person, place, and time. He appears well-developed and well-nourished.  HENT:  Head: Normocephalic and atraumatic.  Right Ear: External ear normal.  Left Ear: External ear normal.  Nose: Nose normal.  Mouth/Throat: Oropharynx is clear and moist.  Eyes: Conjunctivae and EOM are normal. Pupils are equal, round, and reactive to light.  Neck: Normal range of motion. Neck supple.  Cardiovascular: Normal rate, regular rhythm, normal heart sounds and intact distal pulses.   Pulmonary/Chest: Effort normal and breath sounds normal. No respiratory distress. He has no wheezes. He has no rales. He exhibits no tenderness.  Abdominal:  Soft. Bowel sounds are normal. He exhibits no distension and no mass. There is no tenderness. There is no guarding.  Musculoskeletal: Normal range of motion.  Neurological: He is alert and oriented to person, place, and time. He has normal reflexes. He exhibits normal muscle tone. Coordination normal.  Skin: Skin is warm and dry.  Psychiatric: He has a normal mood and affect. His behavior is normal. Judgment and thought content normal.  Nursing note and  vitals reviewed.   ED Course  Procedures (including critical care time) Labs Review Labs Reviewed - No data to display  Imaging Review No results found. I have personally reviewed and evaluated these images and lab results as part of my medical decision-making.   EKG Interpretation None      MDM   Final diagnoses:  URI (upper respiratory infection)    44  Year old male presents today with symptoms consistent with viral URI. He has not any fever and although he has some small sputum production, is not dyspneic. His lungs are clear to auscultation on my exam. I discussed signs and symptoms of bacterial pneumonia and indications for reevaluation. I have discussed duration of symptoms with viral URIs. Plan albuterol HFA with spacer for cough. He is given a note to return to work on Monday.    Margarita Grizzleanielle Derreck Wiltsey, MD 10/17/15 1625  Margarita Grizzleanielle Lincon Sahlin, MD 10/17/15 [phone]

## 2015-10-16 NOTE — Discharge Instructions (Signed)
Upper Respiratory Infection, Adult Most upper respiratory infections (URIs) are a viral infection of the air passages leading to the lungs. A URI affects the nose, throat, and upper air passages. The most common type of URI is nasopharyngitis and is typically referred to as "the common cold." URIs run their course and usually go away on their own. Most of the time, a URI does not require medical attention, but sometimes a bacterial infection in the upper airways can follow a viral infection. This is called a secondary infection. Sinus and middle ear infections are common types of secondary upper respiratory infections. Bacterial pneumonia can also complicate a URI. A URI can worsen asthma and chronic obstructive pulmonary disease (COPD). Sometimes, these complications can require emergency medical care and may be life threatening.  CAUSES Almost all URIs are caused by viruses. A virus is a type of germ and can spread from one person to another.  RISKS FACTORS You may be at risk for a URI if:   You smoke.   You have chronic heart or lung disease.  You have a weakened defense (immune) system.   You are very young or very old.   You have nasal allergies or asthma.  You work in crowded or poorly ventilated areas.  You work in health care facilities or schools. SIGNS AND SYMPTOMS  Symptoms typically develop 2-3 days after you come in contact with a cold virus. Most viral URIs last 7-10 days. However, viral URIs from the influenza virus (flu virus) can last 14-18 days and are typically more severe. Symptoms may include:   Runny or stuffy (congested) nose.   Sneezing.   Cough.   Sore throat.   Headache.   Fatigue.   Fever.   Loss of appetite.   Pain in your forehead, behind your eyes, and over your cheekbones (sinus pain).  Muscle aches.  DIAGNOSIS  Your health care provider may diagnose a URI by:  Physical exam.  Tests to check that your symptoms are not due to  another condition such as:  Strep throat.  Sinusitis.  Pneumonia.  Asthma. TREATMENT  A URI goes away on its own with time. It cannot be cured with medicines, but medicines may be prescribed or recommended to relieve symptoms. Medicines may help:  Reduce your fever.  Reduce your cough.  Relieve nasal congestion. HOME CARE INSTRUCTIONS   Take medicines only as directed by your health care provider.   Gargle warm saltwater or take cough drops to comfort your throat as directed by your health care provider.  Use a warm mist humidifier or inhale steam from a shower to increase air moisture. This may make it easier to breathe.  Drink enough fluid to keep your urine clear or pale yellow.   Eat soups and other clear broths and maintain good nutrition.   Rest as needed.   Return to work when your temperature has returned to normal or as your health care provider advises. You may need to stay home longer to avoid infecting others. You can also use a face mask and careful hand washing to prevent spread of the virus.  Increase the usage of your inhaler if you have asthma.   Do not use any tobacco products, including cigarettes, chewing tobacco, or electronic cigarettes. If you need help quitting, ask your health care provider. PREVENTION  The best way to protect yourself from getting a cold is to practice good hygiene.   Avoid oral or hand contact with people with cold   symptoms.   Wash your hands often if contact occurs.  There is no clear evidence that vitamin C, vitamin E, echinacea, or exercise reduces the chance of developing a cold. However, it is always recommended to get plenty of rest, exercise, and practice good nutrition.  SEEK MEDICAL CARE IF:   You are getting worse rather than better.   Your symptoms are not controlled by medicine.   You have chills.  You have worsening shortness of breath.  You have brown or red mucus.  You have yellow or brown nasal  discharge.  You have pain in your face, especially when you bend forward.  You have a fever.  You have swollen neck glands.  You have pain while swallowing.  You have white areas in the back of your throat. SEEK IMMEDIATE MEDICAL CARE IF:   You have severe or persistent:  Headache.  Ear pain.  Sinus pain.  Chest pain.  You have chronic lung disease and any of the following:  Wheezing.  Prolonged cough.  Coughing up blood.  A change in your usual mucus.  You have a stiff neck.  You have changes in your:  Vision.  Hearing.  Thinking.  Mood. MAKE SURE YOU:   Understand these instructions.  Will watch your condition.  Will get help right away if you are not doing well or get worse.   This information is not intended to replace advice given to you by your health care provider. Make sure you discuss any questions you have with your health care provider.   Document Released: 04/25/2001 Document Revised: 03/16/2015 Document Reviewed: 02/04/2014 Elsevier Interactive Patient Education 2016 [employer]  

## 2016-05-26 ENCOUNTER — Emergency Department (HOSPITAL_BASED_OUTPATIENT_CLINIC_OR_DEPARTMENT_OTHER)
Admission: EM | Admit: 2016-05-26 | Discharge: 2016-05-26 | Disposition: A | Discharge status: Home / Self Care | Payer: Managed Care, Other (non HMO) | Attending: Physician Assistant | Admitting: Physician Assistant

## 2016-05-26 ENCOUNTER — Encounter (HOSPITAL_BASED_OUTPATIENT_CLINIC_OR_DEPARTMENT_OTHER): Payer: Self-pay | Admitting: Emergency Medicine

## 2016-05-26 DIAGNOSIS — B9789 Other viral agents as the cause of diseases classified elsewhere: Secondary | ICD-10-CM

## 2016-05-26 DIAGNOSIS — J988 Other specified respiratory disorders: Secondary | ICD-10-CM

## 2016-05-26 DIAGNOSIS — F17200 Nicotine dependence, unspecified, uncomplicated: Secondary | ICD-10-CM | POA: Diagnosis not present

## 2016-05-26 DIAGNOSIS — J069 Acute upper respiratory infection, unspecified: Secondary | ICD-10-CM | POA: Diagnosis not present

## 2016-05-26 DIAGNOSIS — R05 Cough: Secondary | ICD-10-CM | POA: Diagnosis present

## 2016-05-26 MED ORDER — GUAIFENESIN 100 MG/5ML PO LIQD: 100.0000 mg | ORAL | Status: DC | PRN

## 2016-05-26 MED ORDER — CETIRIZINE-PSEUDOEPHEDRINE ER 5-120 MG PO TB12: 1.0000 | ORAL_TABLET | Freq: Every day | ORAL | Status: DC

## 2016-05-26 MED ORDER — PROMETHAZINE-DM 6.25-15 MG/5ML PO SYRP: 5.0000 mL | ORAL_SOLUTION | Freq: Four times a day (QID) | ORAL | Status: DC | PRN

## 2016-05-26 MED FILL — ROBAFEN 100 MG/5 ML SYRUP: 100 | 3 days supply | Qty: 118 | Fill #0

## 2016-05-26 MED FILL — PROMETHAZINE-DM SYRUP: 6.25-15 | 6 days supply | Qty: 118 | Fill #0

## 2016-05-26 MED FILL — SM ALL DAY ALLERGY-D TABLET: 5-120 | 30 days supply | Qty: 30 | Fill #0

## 2016-05-26 NOTE — ED Notes (Signed)
Pt c/o sinus pressure, cough and congestion x 2 weeks

## 2016-05-26 NOTE — ED Provider Notes (Signed)
CSN: [id number]     Arrival date & time 05/26/16  0912 History   First MD Initiated Contact with Patient 05/26/16 [phone]     Chief Complaint  Patient presents with  . URI     (Consider location/radiation/quality/duration/timing/severity/associated sxs/prior Treatment) HPI   A generally healthy 45 year old male presents with complaints of cough and congestion. Patient report for the past 2 weeks he has had head congestion, sinus congestion, teary-eyed, sneezing, cough productive with yellow sputum. The symptom has been persistent, unrelieved despite taking over-the-counter medication. Denies any associated fever, neck stiffness, hearing changes, sore throat, trouble swallowing, chest pain, shortness of breath, abdominal pain, or rash. Denies any recent environmental changes except that his son recently had a dog for the past month. Patient smokes black and mild on occasion. No other environment changes and no other complaint.  History reviewed. No pertinent past medical history. Past Surgical History  Procedure Laterality Date  . Knee surgery     History reviewed. No pertinent family history. Social History  Substance Use Topics  . Smoking status: Never Smoker   . Smokeless tobacco: Never Used  . Alcohol Use: Yes     Comment: occasional    Review of Systems  All other systems reviewed and are negative.     Allergies  Codeine  Home Medications   Prior to Admission medications   Not on File   BP 115/77 mmHg  Pulse 74  Temp(Src) 98.1 F (36.7 C) (Oral)  Resp 18  Ht 5' 9.5" (1.765 m)  Wt 72.576 kg  BMI 23.30 kg/m2  SpO2 99% Physical Exam  Constitutional: He appears well-developed and well-nourished. No distress.  Afro-American male  sitting in chair in no acute discomfort. Nontoxic in appearance  HENT:  Head: Atraumatic.  Right Ear: External ear normal.  Left Ear: External ear normal.  Mouth/Throat: Oropharynx is clear and moist. No oropharyngeal exudate.  Mildly  boggy left nasal turbinate  Eyes: Conjunctivae are normal.  Neck: Neck supple.  No nuchal rigidity  Cardiovascular: Normal rate and regular rhythm.   Pulmonary/Chest: Effort normal and breath sounds normal. No respiratory distress. He has no wheezes. He has no rales.  Abdominal: Soft. There is no tenderness.  Lymphadenopathy:    He has no cervical adenopathy.  Neurological: He is alert.  Skin: No rash noted.  Psychiatric: He has a normal mood and affect.  Nursing note and vitals reviewed.   ED Course  Procedures (including critical care time)   MDM   Final diagnoses:  Viral respiratory infection    BP 115/77 mmHg  Pulse 74  Temp(Src) 98.1 F (36.7 C) (Oral)  Resp 18  Ht 5' 9.5" (1.765 m)  Wt 72.576 kg  BMI 23.30 kg/m2  SpO2 99%   10:09 AM Patient here with symptoms suggestive of URI versus allergies likely from exposure to a new dog. Lungs are clear, throat is unremarkable, afebrile with stable normal vital signs therefore have low suspicion for pneumonia or other bacterial manifestation. He is well-appearing, plan to treat symptoms with cough suppressants, decongestant, and antihistamine. Return precaution discussed.  Fayrene HelperBowie Audrey Thull, PA-C 05/26/16 1012  Courteney Lyn Mackuen, MD 05/26/16 1431

## 2016-05-26 NOTE — Discharge Instructions (Signed)
Viral Infections °A viral infection can be caused by different types of viruses. Most viral infections are not serious and resolve on their own. However, some infections may cause severe symptoms and may lead to further complications. °SYMPTOMS °Viruses can frequently cause: °· Minor sore throat. °· Aches and pains. °· Headaches. °· Runny nose. °· Different types of rashes. °· Watery eyes. °· Tiredness. °· Cough. °· Loss of appetite. °· Gastrointestinal infections, resulting in nausea, vomiting, and diarrhea. °These symptoms do not respond to antibiotics because the infection is not caused by bacteria. However, you might catch a bacterial infection following the viral infection. This is sometimes called a "superinfection." Symptoms of such a bacterial infection may include: °· Worsening sore throat with pus and difficulty swallowing. °· Swollen neck glands. °· Chills and a high or persistent fever. °· Severe headache. °· Tenderness over the sinuses. °· Persistent overall ill feeling (malaise), muscle aches, and tiredness (fatigue). °· Persistent cough. °· Yellow, green, or brown mucus production with coughing. °HOME CARE INSTRUCTIONS  °· Only take over-the-counter or prescription medicines for pain, discomfort, diarrhea, or fever as directed by your caregiver. °· Drink enough water and fluids to keep your urine clear or pale yellow. Sports drinks can provide valuable electrolytes, sugars, and hydration. °· Get plenty of rest and maintain proper nutrition. Soups and broths with crackers or rice are fine. °SEEK IMMEDIATE MEDICAL CARE IF:  °· You have severe headaches, shortness of breath, chest pain, neck pain, or an unusual rash. °· You have uncontrolled vomiting, diarrhea, or you are unable to keep down fluids. °· You or your child has an oral temperature above 102° F (38.9° C), not controlled by medicine. °· Your baby is older than 3 months with a rectal temperature of 102° F (38.9° C) or higher. °· Your baby is 3  months old or younger with a rectal temperature of 100.4° F (38° C) or higher. °MAKE SURE YOU:  °· Understand these instructions. °· Will watch your condition. °· Will get help right away if you are not doing well or get worse. °  °This information is not intended to replace advice given to you by your health care provider. Make sure you discuss any questions you have with your health care provider. °  °Document Released: 08/09/2005 Document Revised: 01/22/2012 Document Reviewed: 04/07/2015 °Elsevier Interactive Patient Education ©2016 [employer] ° °

## 2016-05-29 ENCOUNTER — Emergency Department (HOSPITAL_BASED_OUTPATIENT_CLINIC_OR_DEPARTMENT_OTHER)
Admission: EM | Admit: 2016-05-29 | Discharge: 2016-05-29 | Disposition: A | Discharge status: Home / Self Care | Payer: Managed Care, Other (non HMO) | Attending: Emergency Medicine | Admitting: Emergency Medicine

## 2016-05-29 ENCOUNTER — Encounter (HOSPITAL_BASED_OUTPATIENT_CLINIC_OR_DEPARTMENT_OTHER): Payer: Self-pay | Admitting: *Deleted

## 2016-05-29 DIAGNOSIS — R05 Cough: Secondary | ICD-10-CM | POA: Diagnosis present

## 2016-05-29 DIAGNOSIS — J0191 Acute recurrent sinusitis, unspecified: Secondary | ICD-10-CM | POA: Diagnosis not present

## 2016-05-29 DIAGNOSIS — F17200 Nicotine dependence, unspecified, uncomplicated: Secondary | ICD-10-CM | POA: Diagnosis not present

## 2016-05-29 MED ORDER — IBUPROFEN 800 MG PO TABS
800.0000 mg | ORAL_TABLET | Freq: Once | ORAL | Status: AC
  Administered 2016-05-29: 800 mg via ORAL
  Filled 2016-05-29: qty 1

## 2016-05-29 MED ORDER — AMOXICILLIN-POT CLAVULANATE 875-125 MG PO TABS: 1.0000 | ORAL_TABLET | Freq: Two times a day (BID) | ORAL | Status: DC

## 2016-05-29 MED ORDER — FLUTICASONE PROPIONATE 50 MCG/ACT NA SUSP: 2.0000 | Freq: Every day | NASAL | Status: DC

## 2016-05-29 MED FILL — AMOX-CLAV 875-125 MG TABLET: 875-125 | 10 days supply | Qty: 20 | Fill #0

## 2016-05-29 MED FILL — SM ALLERGY RELIEF 50 MCG SP: 50 MCG | 30 days supply | Qty: 16 | Fill #0

## 2016-05-29 NOTE — ED Notes (Signed)
Pt reports cough and sinus pressure, cough produces yellow sputum at times. Seen here Friday, rx cough syrups, does not feel any better today.

## 2016-05-29 NOTE — ED Provider Notes (Signed)
CSN: [id number]     Arrival date & time 05/29/16  9629 History   First MD Initiated Contact with Patient 05/29/16 0935     Chief Complaint  Patient presents with  . Cough     (Consider location/radiation/quality/duration/timing/severity/associated sxs/prior Treatment) HPI Comments: 45 year old male with no significant past medical history, chronic smoker presents for nasal congestion and headache. The patient says that over the last week and a half his sinuses have been very sore and full. He reports this happens to him about once a year and usually requires antibiotics for his symptoms. He reports that he has had a cough but that he just feels like he's clearing mucous and that's why he is coughing. He denies any shortness of breath or chest pain. Denies fever. He said he has been trying to use antihistamines and decongestants he was prescribed here on Friday but they have not been working.   History reviewed. No pertinent past medical history. Past Surgical History  Procedure Laterality Date  . Knee surgery     History reviewed. No pertinent family history. Social History  Substance Use Topics  . Smoking status: Current Every Day Smoker  . Smokeless tobacco: Never Used  . Alcohol Use: Yes     Comment: occasional    Review of Systems  Constitutional: Negative for fever, chills, appetite change and fatigue.  HENT: Positive for congestion, postnasal drip, rhinorrhea and sinus pressure. Negative for voice change.   Eyes: Negative for visual disturbance.  Respiratory: Positive for cough (clearing mucous). Negative for chest tightness, shortness of breath, wheezing and stridor.   Cardiovascular: Negative for chest pain and palpitations.  Gastrointestinal: Negative for nausea, vomiting, abdominal pain and diarrhea.  Genitourinary: Negative for dysuria, urgency and hematuria.  Musculoskeletal: Negative for myalgias and back pain.  Skin: Negative for rash.  Neurological: Positive for  headaches. Negative for dizziness, seizures, speech difficulty, weakness, light-headedness and numbness.  Hematological: Does not bruise/bleed easily.      Allergies  Codeine  Home Medications   Prior to Admission medications   Medication Sig Start Date End Date Taking? Authorizing Provider  amoxicillin-clavulanate (AUGMENTIN) 875-125 MG tablet Take 1 tablet by mouth every 12 (twelve) hours. 05/29/16   Leta Baptist, MD  cetirizine-pseudoephedrine (ZYRTEC-D) 5-120 MG tablet Take 1 tablet by mouth daily. 05/26/16   Fayrene Helper, PA-C  fluticasone (FLONASE) 50 MCG/ACT nasal spray Place 2 sprays into both nostrils daily. 05/29/16   Leta Baptist, MD  guaiFENesin (ROBITUSSIN) 100 MG/5ML liquid Take 5-10 mLs (100-200 mg total) by mouth every 4 (four) hours as needed for congestion. 05/26/16   Fayrene Helper, PA-C  promethazine-dextromethorphan (PROMETHAZINE-DM) 6.25-15 MG/5ML syrup Take 5 mLs by mouth 4 (four) times daily as needed for cough. 05/26/16   Fayrene Helper, PA-C   BP 115/85 mmHg  Pulse 89  Temp(Src) 98.8 F (37.1 C) (Oral)  Resp 16  Ht 5' 9.5" (1.765 m)  Wt 160 lb (72.576 kg)  BMI 23.30 kg/m2  SpO2 100% Physical Exam  Constitutional: He is oriented to person, place, and time. He appears well-developed and well-nourished. No distress.  HENT:  Head: Normocephalic and atraumatic.  Right Ear: Tympanic membrane and external ear normal.  Left Ear: Tympanic membrane and external ear normal.  Nose: Rhinorrhea present. No epistaxis. Right sinus exhibits maxillary sinus tenderness and frontal sinus tenderness. Left sinus exhibits maxillary sinus tenderness and frontal sinus tenderness.  Mouth/Throat: Oropharynx is clear and moist. Mucous membranes are not dry. No oropharyngeal exudate, posterior oropharyngeal  edema, posterior oropharyngeal erythema or tonsillar abscesses.  Patient with poor transillumination of the frontal maxillary sinuses. Tenderness over the sinuses. Postnasal drip over  the posterior pharynx.  Eyes: EOM are normal. Pupils are equal, round, and reactive to light.  Neck: Normal range of motion and full passive range of motion without pain. Neck supple. No rigidity. Normal range of motion present.  Cardiovascular: Normal rate, regular rhythm, normal heart sounds and intact distal pulses.   No murmur heard. Pulmonary/Chest: Effort normal. No respiratory distress. He has no wheezes. He has no rales.  Abdominal: Soft. He exhibits no distension. There is no tenderness.  Musculoskeletal: He exhibits no edema.  Neurological: He is alert and oriented to person, place, and time.  Skin: Skin is warm and dry. No rash noted. He is not diaphoretic.  Vitals reviewed.   ED Course  Procedures (including critical care time) Labs Review Labs Reviewed - No data to display  Imaging Review No results found. I have personally reviewed and evaluated these images and lab results as part of my medical decision-making.   EKG Interpretation None      MDM  Patient was seen and evaluated in stable condition. Physical examination and history consistent with acute sinusitis. At this time patient has had symptoms for 1.5 weeks. He has also a smoker. Patient will be discharged home with prescriptions for Flonase and Augmentin. He was instructed to use probiotic to help protect his gut flora. He was discharged home in stable condition. He expressed understanding and agreement with plan of care. Final diagnoses:  Acute recurrent sinusitis, unspecified location    1. Acute sinusitis    Leta BaptistEmily Roe Chantel Teti, MD 05/29/16 1005

## 2016-05-29 NOTE — Discharge Instructions (Signed)
You were seen and evaluated today for your sinus congestion and drainage. It appears that you have sinusitis. Please keep herself well-hydrated. Try to get lots of sleep. You can use Tylenol and Motrin to help with your headache at home. Please take the prescriptions prescribed.  Sinusitis, Adult Sinusitis is redness, soreness, and inflammation of the paranasal sinuses. Paranasal sinuses are air pockets within the bones of your face. They are located beneath your eyes, in the middle of your forehead, and above your eyes. In healthy paranasal sinuses, mucus is able to drain out, and air is able to circulate through them by way of your nose. However, when your paranasal sinuses are inflamed, mucus and air can become trapped. This can allow bacteria and other germs to grow and cause infection. Sinusitis can develop quickly and last only a short time (acute) or continue over a long period (chronic). Sinusitis that lasts for more than 12 weeks is considered chronic. CAUSES Causes of sinusitis include:  Allergies.  Structural abnormalities, such as displacement of the cartilage that separates your nostrils (deviated septum), which can decrease the air flow through your nose and sinuses and affect sinus drainage.  Functional abnormalities, such as when the small hairs (cilia) that line your sinuses and help remove mucus do not work properly or are not present. SIGNS AND SYMPTOMS Symptoms of acute and chronic sinusitis are the same. The primary symptoms are pain and pressure around the affected sinuses. Other symptoms include:  Upper toothache.  Earache.  Headache.  Bad breath.  Decreased sense of smell and taste.  A cough, which worsens when you are lying flat.  Fatigue.  Fever.  Thick drainage from your nose, which often is green and may contain pus (purulent).  Swelling and warmth over the affected sinuses. DIAGNOSIS Your health care provider will perform a physical exam. During your  exam, your health care provider may perform any of the following to help determine if you have acute sinusitis or chronic sinusitis:  Look in your nose for signs of abnormal growths in your nostrils (nasal polyps).  Tap over the affected sinus to check for signs of infection.  View the inside of your sinuses using an imaging device that has a light attached (endoscope). If your health care provider suspects that you have chronic sinusitis, one or more of the following tests may be recommended:  Allergy tests.  Nasal culture. A sample of mucus is taken from your nose, sent to a lab, and screened for bacteria.  Nasal cytology. A sample of mucus is taken from your nose and examined by your health care provider to determine if your sinusitis is related to an allergy. TREATMENT Most cases of acute sinusitis are related to a viral infection and will resolve on their own within 10 days. Sometimes, medicines are prescribed to help relieve symptoms of both acute and chronic sinusitis. These may include pain medicines, decongestants, nasal steroid sprays, or saline sprays. However, for sinusitis related to a bacterial infection, your health care provider will prescribe antibiotic medicines. These are medicines that will help kill the bacteria causing the infection. Rarely, sinusitis is caused by a fungal infection. In these cases, your health care provider will prescribe antifungal medicine. For some cases of chronic sinusitis, surgery is needed. Generally, these are cases in which sinusitis recurs more than 3 times per year, despite other treatments. HOME CARE INSTRUCTIONS  Drink plenty of water. Water helps thin the mucus so your sinuses can drain more easily.  Use  a humidifier.  Inhale steam 3-4 times a day (for example, sit in the bathroom with the shower running).  Apply a warm, moist washcloth to your face 3-4 times a day, or as directed by your health care provider.  Use saline nasal sprays  to help moisten and clean your sinuses.  Take medicines only as directed by your health care provider.  If you were prescribed either an antibiotic or antifungal medicine, finish it all even if you start to feel better. SEEK IMMEDIATE MEDICAL CARE IF:  You have increasing pain or severe headaches.  You have nausea, vomiting, or drowsiness.  You have swelling around your face.  You have vision problems.  You have a stiff neck.  You have difficulty breathing.   This information is not intended to replace advice given to you by your health care provider. Make sure you discuss any questions you have with your health care provider.   Document Released: 10/30/2005 Document Revised: 11/20/2014 Document Reviewed: 11/14/2011 Elsevier Interactive Patient Education [employer]

## 2016-07-03 MED FILL — DOXYCYCLINE HYCLATE 100 MG: 100 | 10 days supply | Qty: 20 | Fill #0

## 2016-10-21 ENCOUNTER — Emergency Department (HOSPITAL_BASED_OUTPATIENT_CLINIC_OR_DEPARTMENT_OTHER)
Admission: EM | Admit: 2016-10-21 | Discharge: 2016-10-21 | Disposition: A | Discharge status: Home / Self Care | Payer: No Typology Code available for payment source | Attending: Emergency Medicine | Admitting: Emergency Medicine

## 2016-10-21 ENCOUNTER — Encounter (HOSPITAL_BASED_OUTPATIENT_CLINIC_OR_DEPARTMENT_OTHER): Payer: Self-pay | Admitting: Emergency Medicine

## 2016-10-21 ENCOUNTER — Emergency Department (HOSPITAL_BASED_OUTPATIENT_CLINIC_OR_DEPARTMENT_OTHER): Payer: No Typology Code available for payment source

## 2016-10-21 DIAGNOSIS — F17200 Nicotine dependence, unspecified, uncomplicated: Secondary | ICD-10-CM | POA: Insufficient documentation

## 2016-10-21 DIAGNOSIS — Y92410 Unspecified street and highway as the place of occurrence of the external cause: Secondary | ICD-10-CM | POA: Diagnosis not present

## 2016-10-21 DIAGNOSIS — Y9389 Activity, other specified: Secondary | ICD-10-CM | POA: Diagnosis not present

## 2016-10-21 DIAGNOSIS — S8391XA Sprain of unspecified site of right knee, initial encounter: Secondary | ICD-10-CM | POA: Diagnosis not present

## 2016-10-21 DIAGNOSIS — Y999 Unspecified external cause status: Secondary | ICD-10-CM | POA: Insufficient documentation

## 2016-10-21 DIAGNOSIS — S8991XA Unspecified injury of right lower leg, initial encounter: Secondary | ICD-10-CM | POA: Diagnosis present

## 2016-10-21 DIAGNOSIS — Z79899 Other long term (current) drug therapy: Secondary | ICD-10-CM | POA: Insufficient documentation
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
## 2016-10-21 NOTE — ED Triage Notes (Signed)
Patient reports that he hurt his knee yesterday
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
## 2017-06-12 ENCOUNTER — Emergency Department (HOSPITAL_BASED_OUTPATIENT_CLINIC_OR_DEPARTMENT_OTHER): Payer: Self-pay

## 2017-06-12 ENCOUNTER — Encounter (HOSPITAL_BASED_OUTPATIENT_CLINIC_OR_DEPARTMENT_OTHER): Payer: Self-pay | Admitting: *Deleted

## 2017-06-12 ENCOUNTER — Emergency Department (HOSPITAL_BASED_OUTPATIENT_CLINIC_OR_DEPARTMENT_OTHER)
Admission: EM | Admit: 2017-06-12 | Discharge: 2017-06-12 | Disposition: A | Discharge status: Home / Self Care | Payer: Self-pay | Attending: Emergency Medicine | Admitting: Emergency Medicine

## 2017-06-12 DIAGNOSIS — M545 Low back pain, unspecified: Secondary | ICD-10-CM

## 2017-06-12 DIAGNOSIS — F17200 Nicotine dependence, unspecified, uncomplicated: Secondary | ICD-10-CM | POA: Insufficient documentation

## 2017-06-12 MED ORDER — METHOCARBAMOL 500 MG PO TABS
750.0000 mg | ORAL_TABLET | Freq: Once | ORAL | Status: AC
  Administered 2017-06-12: 750 mg via ORAL
  Filled 2017-06-12: qty 2

## 2017-06-12 MED ORDER — METHOCARBAMOL 500 MG PO TABS: 500.0000 mg | ORAL_TABLET | Freq: Two times a day (BID) | ORAL | 0 refills | Status: DC

## 2017-06-12 MED ORDER — TRAMADOL HCL 50 MG PO TABS
50.0000 mg | ORAL_TABLET | Freq: Once | ORAL | Status: AC
  Administered 2017-06-12: 50 mg via ORAL
  Filled 2017-06-12: qty 1

## 2017-06-12 MED ORDER — IBUPROFEN 600 MG PO TABS: 600.0000 mg | ORAL_TABLET | Freq: Four times a day (QID) | ORAL | 0 refills | Status: DC | PRN

## 2017-06-12 MED ORDER — TRAMADOL HCL 50 MG PO TABS: 50.0000 mg | ORAL_TABLET | Freq: Four times a day (QID) | ORAL | 0 refills | Status: DC | PRN

## 2017-06-12 MED FILL — METHOCARBAMOL 500 MG TABLET: 500 | 10 days supply | Qty: 20 | Fill #0

## 2017-06-12 MED FILL — IBUPROFEN 600 MG TABLET: 600 | 8 days supply | Qty: 30 | Fill #0

## 2017-06-12 MED FILL — traMADol HCL 50 MG TABS: 50 | 3 days supply | Qty: 12 | Fill #0

## 2017-06-12 NOTE — ED Triage Notes (Signed)
Lower back pain x 3 days. Started after heavy lifting.

## 2017-06-12 NOTE — Discharge Instructions (Signed)
Take your medications as prescribed. You may also apply ice and/or heat to affected area for 15-20 minutes 3-4 times daily for additional pain relief. I recommend refraining from doing any heavy lifting, squatting or repetitive movements that exacerbate her symptoms for the next few days. °Follow-up with your primary care provider in the next week if her symptoms have not improved. °Please return to the Emergency Department if symptoms worsen or new onset of denies fever, numbness, tingling, groin anesthesia, loss of bowel or bladder, weakness, chest pain, abdominal pain, vomiting. °

## 2017-06-12 NOTE — ED Provider Notes (Signed)
[facility] Provider Note   CSN: [id number] Arrival date & time: 06/12/17  1223     History   Chief Complaint Chief Complaint  Patient presents with  . Back Pain    HPI Peter Myers is a 46 y.o. male.  HPI   Patient is a 46 year old male with no pertinent past medical history presents the ED with complaint of low back pain, onset 3 days. Patient states on Saturday at work he lifted a big box of dye. He states he believes the box weighed over 100 pounds and notes immediately after lifting he began having sharp pain across his lower back. Denies radiation. Patient states pain is worse with movement, change of position or bending. He states he has been taking Tylenol, Aleve and BC powder at home with mild intermittent relief. He also states she has been applying a heating pack with improvement of symptoms. enies any other recent fall, trauma or injury. Pt denies fever, numbness, tingling, saddle anesthesia, loss of bowel or bladder, weakness, CP, SOB, abdominal pain, urinary sxs, IVDU, cancer or recent spinal manipulation.   History reviewed. No pertinent past medical history.  Patient Active Problem List   Diagnosis Date Noted  . Pain, dental 02/10/2014    Past Surgical History:  Procedure Laterality Date  . KNEE SURGERY         Home Medications    Prior to Admission medications   Medication Sig Start Date End Date Taking? Authorizing Provider  amoxicillin-clavulanate (AUGMENTIN) 875-125 MG tablet Take 1 tablet by mouth every 12 (twelve) hours. 05/29/16   Leta BaptistNguyen, Emily Roe, MD  cetirizine-pseudoephedrine (ZYRTEC-D) 5-120 MG tablet Take 1 tablet by mouth daily. 05/26/16   Fayrene Helperran, Bowie, PA-C  fluticasone (FLONASE) 50 MCG/ACT nasal spray Place 2 sprays into both nostrils daily. 05/29/16   Leta BaptistNguyen, Emily Roe, MD  guaiFENesin (ROBITUSSIN) 100 MG/5ML liquid Take 5-10 mLs (100-200 mg total) by mouth every 4 (four) hours as needed for congestion. 05/26/16   Fayrene Helperran, Bowie,  PA-C  ibuprofen (ADVIL,MOTRIN) 600 MG tablet Take 1 tablet (600 mg total) by mouth every 6 (six) hours as needed. 06/12/17   Barrett HenleNadeau, Nataniel Gasper Elizabeth, PA-C  methocarbamol (ROBAXIN) 500 MG tablet Take 1 tablet (500 mg total) by mouth 2 (two) times daily. 06/12/17   Barrett HenleNadeau, Kenyata Napier Elizabeth, PA-C  promethazine-dextromethorphan (PROMETHAZINE-DM) 6.25-15 MG/5ML syrup Take 5 mLs by mouth 4 (four) times daily as needed for cough. 05/26/16   Fayrene Helperran, Bowie, PA-C  traMADol (ULTRAM) 50 MG tablet Take 1 tablet (50 mg total) by mouth every 6 (six) hours as needed. 06/12/17   Barrett HenleNadeau, Jacobus Colvin Elizabeth, PA-C    Family History No family history on file.  Social History Social History  Substance Use Topics  . Smoking status: Current Every Day Smoker  . Smokeless tobacco: Never Used  . Alcohol use Yes     Comment: occasional     Allergies   Codeine   Review of Systems Review of Systems  Musculoskeletal: Positive for back pain.  All other systems reviewed and are negative.    Physical Exam Updated Vital Signs BP 108/80   Pulse 92   Temp 98.9 F (37.2 C) (Oral)   Resp 16   Ht 5' 9.5" (1.765 m)   Wt 72.6 kg (160 lb)   SpO2 100%   BMI 23.29 kg/m   Physical Exam  Constitutional: He is oriented to person, place, and time. He appears well-developed and well-nourished. No distress.  HENT:  Head: Normocephalic and atraumatic.  Eyes: Conjunctivae and EOM are normal. Right eye exhibits no discharge. Left eye exhibits no discharge. No scleral icterus.  Neck: Normal range of motion. Neck supple.  Cardiovascular: Normal rate, regular rhythm, normal heart sounds and intact distal pulses.   Pulmonary/Chest: Effort normal and breath sounds normal. No respiratory distress. He has no wheezes. He has no rales. He exhibits no tenderness.  Abdominal: Soft. He exhibits no distension and no mass. There is no tenderness. There is no rebound and no guarding.  Musculoskeletal: Normal range of motion. He exhibits  tenderness. He exhibits no edema or deformity.  No midline C, T tenderness. TTP over lumbar midline spine and bilateral paraspinal muscles. Full range of motion of neck and dec ROM of back due to reported pain. Full range of motion of bilateral upper and lower extremities, with 5/5 strength. Sensation intact. 2+ radial and PT pulses. Cap refill <2 seconds. Patient able to stand and ambulate without assistance but endorses pain.    Neurological: He is alert and oriented to person, place, and time. He has normal strength. He displays normal reflexes. No sensory deficit.  Skin: Skin is warm and dry. He is not diaphoretic.  Nursing note and vitals reviewed.    ED Treatments / Results  Labs (all labs ordered are listed, but only abnormal results are displayed) Labs Reviewed - No data to display  EKG  EKG Interpretation None       Radiology Dg Lumbar Spine Complete  Result Date: 06/12/2017 CLINICAL DATA:  46 year old male with mid lower back pain after lifting heavy objects 06/09/2017. Initial encounter. EXAM: LUMBAR SPINE - COMPLETE 4+ VIEW COMPARISON:  None. FINDINGS: Minimal curvature lumbar spine convex right. No significant disc space narrowing. No pars defect.  No vertebral body compression fracture. IMPRESSION: Minimal curvature lumbar spine convex right. No significant disc space narrowing. Electronically Signed   By: Lacy DuverneySteven  Olson M.D.   On: 06/12/2017 13:09    Procedures Procedures (including critical care time)  Medications Ordered in ED Medications  methocarbamol (ROBAXIN) tablet 750 mg (750 mg Oral Given 06/12/17 1306)  traMADol (ULTRAM) tablet 50 mg (50 mg Oral Given 06/12/17 1306)     Initial Impression / Assessment and Plan / ED Course  I have reviewed the triage vital signs and the nursing notes.  Pertinent labs & imaging results that were available during my care of the patient were reviewed by me and considered in my medical decision making (see chart for  details).     Patient with low back pain s/p lifting a heavy box at work.  No neurological deficits and normal neuro exam.  Patient can walk but states is painful.  No loss of bowel or bladder control.  No concern for cauda equina.  No fever, night sweats, weight loss, h/o cancer, IVDU. Patient given pain meds in the ED. Lumbar spine x-ray with no acute abnormalities. On reevaluation patient reports improvement of pain. Discussed results and plan for discharge with patient. RICE protocol and pain medicine indicated and discussed with patient.  Advised to follow up with PCP as needed. Discussed return precautions.  Final Clinical Impressions(s) / ED Diagnoses   Final diagnoses:  Acute bilateral low back pain without sciatica    New Prescriptions New Prescriptions   IBUPROFEN (ADVIL,MOTRIN) 600 MG TABLET    Take 1 tablet (600 mg total) by mouth every 6 (six) hours as needed.   METHOCARBAMOL (ROBAXIN) 500 MG TABLET    Take 1 tablet (500 mg total) by mouth  2 (two) times daily.   TRAMADOL (ULTRAM) 50 MG TABLET    Take 1 tablet (50 mg total) by mouth every 6 (six) hours as needed.     Barrett Henle, PA-C 06/12/17 1327    Geoffery Lyons, MD 06/12/17 [phone]

## 2017-06-12 NOTE — ED Notes (Signed)
Patient c/o low back started today.  Pt stated that it started after he was lifting this Saturday at work.

## 2018-03-21 ENCOUNTER — Encounter (HOSPITAL_BASED_OUTPATIENT_CLINIC_OR_DEPARTMENT_OTHER): Payer: Self-pay

## 2018-03-21 ENCOUNTER — Other Ambulatory Visit: Payer: Self-pay

## 2018-03-21 ENCOUNTER — Emergency Department (HOSPITAL_BASED_OUTPATIENT_CLINIC_OR_DEPARTMENT_OTHER): Payer: BLUE CROSS/BLUE SHIELD

## 2018-03-21 ENCOUNTER — Emergency Department (HOSPITAL_BASED_OUTPATIENT_CLINIC_OR_DEPARTMENT_OTHER)
Admission: EM | Admit: 2018-03-21 | Discharge: 2018-03-21 | Disposition: A | Discharge status: Home / Self Care | Payer: BLUE CROSS/BLUE SHIELD | Attending: Emergency Medicine | Admitting: Emergency Medicine

## 2018-03-21 DIAGNOSIS — Z87891 Personal history of nicotine dependence: Secondary | ICD-10-CM | POA: Diagnosis not present

## 2018-03-21 DIAGNOSIS — J0100 Acute maxillary sinusitis, unspecified: Secondary | ICD-10-CM | POA: Diagnosis not present

## 2018-03-21 DIAGNOSIS — R059 Cough, unspecified: Secondary | ICD-10-CM

## 2018-03-21 DIAGNOSIS — R05 Cough: Secondary | ICD-10-CM

## 2018-03-21 MED ORDER — AMOXICILLIN-POT CLAVULANATE 875-125 MG PO TABS: 1.0000 | ORAL_TABLET | Freq: Two times a day (BID) | ORAL | 0 refills | Status: DC

## 2018-03-21 MED ORDER — BENZONATATE 100 MG PO CAPS: 100.0000 mg | ORAL_CAPSULE | Freq: Three times a day (TID) | ORAL | 0 refills | Status: AC

## 2018-03-21 MED FILL — AMOX-CLAV 875-125 MG TABLET: 875-125 | 7 days supply | Qty: 14 | Fill #0

## 2018-03-21 MED FILL — BENZONATATE 100 MG CAP: 100 | 5 days supply | Qty: 15 | Fill #0

## 2018-03-21 NOTE — ED Provider Notes (Signed)
[facility] EMERGENCY DEPARTMENT Provider Note   CSN: [id number] Arrival date & time: 03/21/18  1150     History   Chief Complaint Chief Complaint  Patient presents with  . Cough    HPI Peter Myers is a 47 y.o. male.  Patient presents to the emergency department with complaint of cough and sinus congestion.  Symptoms started acutely.  Today is his ninth day of symptoms.  He has been using over-the-counter medications (Vicks) starting yesterday which have not helped.  He denies any fevers, nausea or vomiting.  No ear pain.  He has had persistent, nonproductive cough.  No chest pain or shortness of breath.  No nausea, vomiting, or diarrhea.  He notes sick contacts at work.     History reviewed. No pertinent past medical history.  Patient Active Problem List   Diagnosis Date Noted  . Pain, dental 02/10/2014    Past Surgical History:  Procedure Laterality Date  . KNEE SURGERY          Home Medications    Prior to Admission medications   Medication Sig Start Date End Date Taking? Authorizing Provider  amoxicillin-clavulanate (AUGMENTIN) 875-125 MG tablet Take 1 tablet by mouth every 12 (twelve) hours. 03/21/18   Renne Crigler, PA-C  benzonatate (TESSALON) 100 MG capsule Take 1 capsule (100 mg total) by mouth every 8 (eight) hours. 03/21/18   Renne Crigler, PA-C    Family History No family history on file.  Social History Social History   Tobacco Use  . Smoking status: Former Games developer  . Smokeless tobacco: Never Used  Substance Use Topics  . Alcohol use: Yes    Comment: occ  . Drug use: Not Currently     Allergies   Codeine   Review of Systems Review of Systems  Constitutional: Negative for chills, fatigue and fever.  HENT: Positive for congestion and sinus pressure. Negative for ear pain, rhinorrhea and sore throat.   Eyes: Negative for redness.  Respiratory: Positive for cough. Negative for shortness of breath and wheezing.     Gastrointestinal: Negative for abdominal pain, diarrhea, nausea and vomiting.  Genitourinary: Negative for dysuria.  Musculoskeletal: Negative for myalgias and neck stiffness.  Skin: Negative for rash.  Neurological: Negative for headaches.  Hematological: Negative for adenopathy.     Physical Exam Updated Vital Signs BP 125/85 (BP Location: Right Arm)   Pulse 75   Temp 98.4 F (36.9 C) (Oral)   Resp 18   Ht  (1.753 m)   Wt 77.1 kg (170 lb)   SpO2 97%   BMI 25.10 kg/m   Physical Exam  Constitutional: He appears well-developed and well-nourished.  HENT:  Head: Normocephalic and atraumatic.  Right Ear: Tympanic membrane, external ear and ear canal normal.  Left Ear: Tympanic membrane, external ear and ear canal normal.  Nose: Mucosal edema present. No rhinorrhea. Right sinus exhibits maxillary sinus tenderness. Right sinus exhibits no frontal sinus tenderness. Left sinus exhibits maxillary sinus tenderness. Left sinus exhibits no frontal sinus tenderness.  Mouth/Throat: Uvula is midline, oropharynx is clear and moist and mucous membranes are normal. Mucous membranes are not dry. No trismus in the jaw. No uvula swelling. No oropharyngeal exudate, posterior oropharyngeal edema, posterior oropharyngeal erythema or tonsillar abscesses.  Eyes: Conjunctivae are normal. Right eye exhibits no discharge. Left eye exhibits no discharge.  Neck: Normal range of motion. Neck supple.  Cardiovascular: Normal rate, regular rhythm and normal heart sounds.  Pulmonary/Chest: Effort normal and breath sounds normal.  No respiratory distress. He has no wheezes. He has no rales.  Abdominal: Soft. There is no tenderness.  Neurological: He is alert.  Skin: Skin is warm and dry.  Psychiatric: He has a normal mood and affect.  Nursing note and vitals reviewed.    ED Treatments / Results  Labs (all labs ordered are listed, but only abnormal results are displayed) Labs Reviewed - No data to  display  EKG None  Radiology Dg Chest 2 View  Result Date: 03/21/2018 CLINICAL DATA:  Cough,fever and congestion for a day,nonsmoker EXAM: CHEST - 2 VIEW COMPARISON:  None. FINDINGS: The heart size and mediastinal contours are within normal limits. Both lungs are clear. The visualized skeletal structures are unremarkable. IMPRESSION: No active cardiopulmonary disease. No evidence of pneumonia or pulmonary edema. Electronically Signed   By: Bary Richard M.D.   On: 03/21/2018 12:50    Procedures Procedures (including critical care time)  Medications Ordered in ED Medications - No data to display   Initial Impression / Assessment and Plan / ED Course  I have reviewed the triage vital signs and the nursing notes.  Pertinent labs & imaging results that were available during my care of the patient were reviewed by me and considered in my medical decision making (see chart for details).     Patient seen and examined. Discussed results of CXR.   Vital signs reviewed and are as follows: BP 125/85 (BP Location: Right Arm)   Pulse 75   Temp 98.4 F (36.9 C) (Oral)   Resp 18   Ht  (1.753 m)   Wt 77.1 kg (170 lb)   SpO2 97%   BMI 25.10 kg/m   Home with Augmentin and Tessalon.  Encouraged use of over-the-counter medications as desired.  Encouraged return to the emergency department or follow-up with his doctor in 5 days if not improving.  Final Clinical Impressions(s) / ED Diagnoses   Final diagnoses:  Acute non-recurrent maxillary sinusitis  Cough   Patient with sinusitis symptoms and cough, ongoing for 9 days.  No fevers.  Given duration of symptoms will give course of antibiotics.  Tessalon for cough.  Patient may use other medications.  He appears well, nontoxic.  Chest x-ray does not demonstrate any pneumonia.  Lungs are clear without wheezing.   ED Discharge Orders        Ordered    amoxicillin-clavulanate (AUGMENTIN) 875-125 MG tablet  Every 12 hours     03/21/18  1345    benzonatate (TESSALON) 100 MG capsule  Every 8 hours     03/21/18 1345       Renne Crigler, PA-C 03/21/18 1351    Cardama, Amadeo Garnet, MD 03/21/18 1524

## 2018-03-21 NOTE — ED Triage Notes (Signed)
C/o flu like sx x 6-7 days-NAD-steady gait

## 2018-03-21 NOTE — Discharge Instructions (Signed)
Please read and follow all provided instructions.  Your diagnoses today include:  1. Acute non-recurrent maxillary sinusitis   2. Cough    Tests performed today include:  Vital signs. See below for your results today.   Medications prescribed:   Augmentin - antibiotic  You have been prescribed an antibiotic medicine: take the entire course of medicine even if you are feeling better. Stopping early can cause the antibiotic not to work.   Tessalon Perles - cough suppressant medication  Take any prescribed medications only as directed. Treatment for your infection is aimed at treating the symptoms. There are no medications, such as antibiotics, that will cure your infection.   Home care instructions:  Follow any educational materials contained in this packet.   Follow-up instructions: Please follow-up with your primary care provider in the next 5 days for further evaluation of your symptoms if you are not feeling better.   Return instructions:   Please return to the Emergency Department if you experience worsening symptoms.   RETURN IMMEDIATELY IF you develop shortness of breath, confusion or altered mental status, a new rash, become dizzy, faint, or poorly responsive, or are unable to be cared for at home.  Please return if you have persistent vomiting and cannot keep down fluids or develop a fever that is not controlled by tylenol or motrin.    Please return if you have any other emergent concerns.  Additional Information:  Your vital signs today were: BP 125/85 (BP Location: Right Arm)    Pulse 75    Temp 98.4 F (36.9 C) (Oral)    Resp 18    Ht  (1.753 m)    Wt 77.1 kg (170 lb)    SpO2 97%    BMI 25.10 kg/m  If your blood pressure (BP) was elevated above 135/85 this visit, please have this repeated by your doctor within one month. --------------

## 2018-06-24 ENCOUNTER — Ambulatory Visit (INDEPENDENT_AMBULATORY_CARE_PROVIDER_SITE_OTHER): Payer: BLUE CROSS/BLUE SHIELD | Admitting: Medical

## 2018-06-24 ENCOUNTER — Encounter: Payer: Self-pay | Admitting: Medical

## 2018-06-24 VITALS — BP 124/80 | HR 91 | Temp 98.4°F | Resp 16 | Ht 68.0 in | Wt 183.0 lb

## 2018-06-24 DIAGNOSIS — J309 Allergic rhinitis, unspecified: Secondary | ICD-10-CM | POA: Diagnosis not present

## 2018-06-24 MED ORDER — LEVOCETIRIZINE DIHYDROCHLORIDE 5 MG PO TABS: 5.0000 mg | ORAL_TABLET | Freq: Every evening | ORAL | 3 refills | Status: AC

## 2018-06-24 MED ORDER — AZITHROMYCIN 250 MG PO TABS: ORAL_TABLET | ORAL | 0 refills | Status: DC

## 2018-06-24 MED ORDER — FLUTICASONE PROPIONATE 50 MCG/ACT NA SUSP: 2.0000 | Freq: Every day | NASAL | 3 refills | Status: AC

## 2018-06-24 MED ORDER — METHYLPREDNISOLONE ACETATE 40 MG/ML IJ SUSP
40.0000 mg | Freq: Once | INTRAMUSCULAR | Status: AC
Start: 2018-06-24 — End: 2018-06-24
  Administered 2018-06-24: 40 mg via INTRAMUSCULAR

## 2018-06-24 MED FILL — LEVOCETIRIZINE 5 MG TABLET: 5 | 30 days supply | Qty: 30 | Fill #0

## 2018-06-24 MED FILL — SM ALLERGY RELIEF 50 MCG SP: 50 MCG | 30 days supply | Qty: 16 | Fill #0

## 2018-06-24 NOTE — Progress Notes (Signed)
Subjective:    Patient ID: Peter Myers, male    DOB: 10/17/1971, 47 y.o.   MRN: [id number]  HPI  Pt in for first time. He needed to get established. He needs physical next Monday.    Pt works at [employer]. No official exercise. He walks a lot at work. Drinks one cup coffee, drinks 5 sodas, Pt admits not eating healthy. Married.    Pt has had nasal congestion for about 2 weeks. Sneezing and nose has been running. 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 Also when weather changes will get allergy symptoms. Pt has tried sudafed and Care[employer] adviserallegra. He states combination did not help. Also some runny nose.  He is blowing out some colored mucus. From nose.      Review of Systems  Constitutional: Negative for chills, fatigue and fever.  HENT: Positive for congestion, postnasal drip, rhinorrhea and sinus pressure. Negative for ear pain, facial swelling, sneezing, tinnitus and trouble swallowing.   Respiratory: Negative for cough, chest tightness, shortness of breath and wheezing.   Cardiovascular: Negative for chest pain and palpitations.  Musculoskeletal: Negative for back pain.  Skin: Negative for rash.  Neurological: Negative for dizziness, seizures, weakness, numbness and headaches.  Psychiatric/Behavioral: Negative for behavioral problems, confusion, dysphoric mood, self-injury and suicidal ideas. The patient is not nervous/anxious.     No past medical history on file.   Social History   Socioeconomic History  . Marital status: Married    Spouse name: Not on file  . Number of children: Not on file  . Years of education: Not on file  . Highest education level: Not on file  Occupational History  . Not on file  Social Needs  . Financial resource strain: Not on file  . Food insecurity:    Worry: Not on file    Inability: Not on file  . Transportation needs:    Medical: Not on file    Non-medical: Not on file  Tobacco Use  . Smoking status: Former Games developermoker  .  Smokeless tobacco: Never Used  Substance and Sexual Activity  . Alcohol use: Yes    Comment: occ  . Drug use: Not Currently  . Sexual activity: Not on file  Lifestyle  . Physical activity:    Days per week: Not on file    Minutes per session: Not on file  . Stress: Not on file  Relationships  . Social connections:    Talks on phone: Not on file    Gets together: Not on file    Attends religious service: Not on file    Active member of club or organization: Not on file    Attends meetings of clubs or organizations: Not on file    Relationship status: Not on file  . Intimate partner violence:    Fear of current or ex partner: Not on file    Emotionally abused: Not on file    Physically abused: Not on file    Forced sexual activity: Not on file  Other Topics Concern  . Not on file  Social History Narrative  . Not on file    Past Surgical History:  Procedure Laterality Date  . KNEE SURGERY      No family history on file.  Allergies  Allergen Reactions  . Codeine Anaphylaxis    Welts  Welts     Current Outpatient Medications on File Prior to Visit  Medication Sig Dispense Refill  . amoxicillin-clavulanate (AUGMENTIN) 875-125 MG tablet  Take 1 tablet by mouth every 12 (twelve) hours. 14 tablet 0  . benzonatate (TESSALON) 100 MG capsule Take 1 capsule (100 mg total) by mouth every 8 (eight) hours. 15 capsule 0   No current facility-administered medications on file prior to visit.     BP 124/80   Pulse 91   Temp 98.4 F (36.9 C) (Oral)   Resp 16   Ht 5\' 8"  (1.727 m)   Wt 183 lb (83 kg)   SpO2 98%   BMI 27.83 kg/m       Objective:   Physical Exam  General  Mental Status - Alert. General Appearance - Well groomed. Not in acute distress.  Skin Rashes- No Rashes.  HEENT Head- Normal. Ear Auditory Canal - Left- Normal. Right - Normal.Tympanic Membrane- Left- Normal. Right- Normal. Eye Sclera/Conjunctiva- Left- Normal. Right- Normal. Nose & Sinuses  Nasal Mucosa- Left-  Boggy and Congested. Right-  Boggy and  Congested.Bilateral faint  maxillary but no ffrontal sinus pressure. Mouth & Throat Lips: Upper Lip- Normal: no dryness, cracking, pallor, cyanosis, or vesicular eruption. Lower Lip-Normal: no dryness, cracking, pallor, cyanosis or vesicular eruption. Buccal Mucosa- Bilateral- No Aphthous ulcers. Oropharynx- No Discharge or Erythema. Tonsils: Characteristics- Bilateral- No Erythema or Congestion. Size/Enlargement- Bilateral- No enlargement. Discharge- bilateral-None.  Neck Neck- Supple. No Masses.   Chest and Lung Exam Auscultation: Breath Sounds:-Clear even and unlabored.  Cardiovascular Auscultation:Rythm- Regular, rate and rhythm. Murmurs & Other Heart Sounds:Ausculatation of the heart reveal- No Murmurs.  Lymphatic Head & Neck General Head & Neck Lymphatics: Bilateral: Description- No Localized lymphadenopathy.       Assessment & Plan:  You do appear to have allergies that have been resistant to allegra use. I do think you would benefit from low dose depo medrol injection, xyzal antihistamine and use flonase nasal spray.  If you have worse sinus pain symptoms or more mucus production then can start azithromycin antibiotic. This is provided as print prescription. You probably won't need this  but making this available in event worsening signs/symptoms.  Follow up in one week for physical exam.   Esperanza RichtersEdward Laiklyn Pilkenton, PA-C

## 2018-06-24 NOTE — Patient Instructions (Addendum)
You do appear to have allergies that have been resistant to allegra use. I do think you would benefit from low dose depo medrol injection, xyzal antihistamine and use flonase nasal spray.  If you have worse sinus pain symptoms or more mucus production then can start azithromycin antibiotic. This is provided as print prescription. You probably won't need this but making this available in event worsening signs/symptoms.  Follow up in one week for physical exam.

## 2018-07-01 ENCOUNTER — Encounter: Payer: Self-pay | Admitting: Medical

## 2018-07-01 ENCOUNTER — Ambulatory Visit (INDEPENDENT_AMBULATORY_CARE_PROVIDER_SITE_OTHER): Payer: BLUE CROSS/BLUE SHIELD | Admitting: Medical

## 2018-07-01 ENCOUNTER — Telehealth: Payer: Self-pay | Admitting: Medical

## 2018-07-01 VITALS — BP 113/75 | HR 81 | Temp 98.7°F | Resp 16 | Ht 68.0 in | Wt 184.2 lb

## 2018-07-01 DIAGNOSIS — R319 Hematuria, unspecified: Secondary | ICD-10-CM

## 2018-07-01 DIAGNOSIS — Z125 Encounter for screening for malignant neoplasm of prostate: Secondary | ICD-10-CM

## 2018-07-01 DIAGNOSIS — Z0000 Encounter for general adult medical examination without abnormal findings: Secondary | ICD-10-CM | POA: Diagnosis not present

## 2018-07-01 LAB — COMPREHENSIVE METABOLIC PANEL
ALBUMIN: 4.5 g/dL (ref 3.5–5.2)
ALK PHOS: 68 U/L (ref 39–117)
ALT: 17 U/L (ref 0–53)
AST: 18 U/L (ref 0–37)
BUN: 10 mg/dL (ref 6–23)
CO2: 28 mEq/L (ref 19–32)
Calcium: 10 mg/dL (ref 8.4–10.5)
Chloride: 103 mEq/L (ref 96–112)
Creatinine, Ser: 1.24 mg/dL (ref 0.40–1.50)
GFR: 79.59 mL/min (ref >60.00)
Glucose, Bld: 106 mg/dL — ABNORMAL HIGH (ref 70–99)
POTASSIUM: 4.3 meq/L (ref 3.5–5.1)
Sodium: 139 mEq/L (ref 135–145)
Total Bilirubin: 0.5 mg/dL (ref 0.2–1.2)
Total Protein: 7.3 g/dL (ref 6.0–8.3)

## 2018-07-01 LAB — POC URINALSYSI DIPSTICK (AUTOMATED)
BILIRUBIN UA: NEGATIVE
Glucose, UA: NEGATIVE
KETONES UA: NEGATIVE
Leukocytes, UA: NEGATIVE
Nitrite, UA: NEGATIVE
PROTEIN UA: POSITIVE — AB
Spec Grav, UA: 1.02 (ref 1.010–1.025)
Urobilinogen, UA: 1 E.U./dL
pH, UA: 6 (ref 5.0–8.0)

## 2018-07-01 LAB — CBC WITH DIFFERENTIAL/PLATELET
BASOS PCT: 0.3 % (ref 0.0–3.0)
Basophils Absolute: 0 10*3/uL (ref 0.0–0.1)
Eosinophils Absolute: 0.1 10*3/uL (ref 0.0–0.7)
Eosinophils Relative: 2.9 % (ref 0.0–5.0)
HCT: 44.3 % (ref 39.0–52.0)
HEMOGLOBIN: 14.6 g/dL (ref 13.0–17.0)
LYMPHS ABS: 1.8 10*3/uL (ref 0.7–4.0)
Lymphocytes Relative: 40.3 % (ref 12.0–46.0)
MCHC: 33 g/dL (ref 30.0–36.0)
MCV: 94.8 fl (ref 78.0–100.0)
MONO ABS: 0.3 10*3/uL (ref 0.1–1.0)
Monocytes Relative: 7.1 % (ref 3.0–12.0)
Neutro Abs: 2.3 10*3/uL (ref 1.4–7.7)
Neutrophils Relative %: 49.4 % (ref 43.0–77.0)
Platelets: 316 10*3/uL (ref 150.0–400.0)
RBC: 4.67 Mil/uL (ref 4.22–5.81)
RDW: 13.2 % (ref 11.5–15.5)
WBC: 4.6 10*3/uL (ref 4.0–10.5)

## 2018-07-01 LAB — PSA: PSA: 0.77 ng/mL (ref 0.10–4.00)

## 2018-07-01 LAB — LIPID PANEL
CHOLESTEROL: 190 mg/dL (ref 0–200)
HDL: 52.8 mg/dL (ref >39.00)
LDL Cholesterol: 127 mg/dL — ABNORMAL HIGH (ref 0–99)
NonHDL: 137.6
TRIGLYCERIDES: 55 mg/dL (ref 0.0–149.0)
Total CHOL/HDL Ratio: 4
VLDL: 11 mg/dL (ref 0.0–40.0)

## 2018-07-01 NOTE — Progress Notes (Signed)
Subjective:    Patient ID: Peter Myers, male    DOB: 06/07/1971, 47 y.o.   MRN: [id number]  HPI  Pt in for follow up.  Pt is fasting today.  Pt is due for tdap. He declines tdap.  Pt declines hiv screen.  Pt works at [employer]. No official exercise. He walks a lot at work. Drinks one cup coffee, drinks 5 sodas, Pt admits not eating healthy. Married.     Review of Systems  Constitutional: Negative for chills, fatigue and fever.  Respiratory: Negative for chest tightness, shortness of breath, wheezing and stridor.   Cardiovascular: Negative for chest pain and palpitations.  Gastrointestinal: Negative for abdominal pain, constipation, diarrhea, nausea and vomiting.  Musculoskeletal: Negative for back pain and gait problem.  Skin: Negative for rash.  Hematological: Negative for adenopathy. Does not bruise/bleed easily.  Psychiatric/Behavioral: Negative for behavioral problems, decreased concentration, hallucinations, self-injury and suicidal ideas. The patient is not nervous/anxious.     Past Medical History:  Diagnosis Date  . Allergy   . GERD (gastroesophageal reflux disease)    occasional. Not daily.     Social History   Socioeconomic History  . Marital status: Married    Spouse name: Not on file  . Number of children: Not on file  . Years of education: Not on file  . Highest education level: Not on file  Occupational History  . Not on file  Social Needs  . Financial resource strain: Not on file  . Food insecurity:    Worry: Not on file    Inability: Not on file  . Transportation needs:    Medical: Not on file    Non-medical: Not on file  Tobacco Use  . Smoking status: Former Smoker    Packs/day: 0.25    Years: 1.00    Pack years: 0.25    Last attempt to quit: 06/24/2017    Years since quitting: 1.0  . Smokeless tobacco: Never Used  Substance and Sexual Activity  . Alcohol use: Yes    Comment: occsional on weekends. 1-2 beers.  . Drug use:  Not Currently  . Sexual activity: Yes  Lifestyle  . Physical activity:    Days per week: Not on file    Minutes per session: Not on file  . Stress: Not on file  Relationships  . Social connections:    Talks on phone: Not on file    Gets together: Not on file    Attends religious service: Not on file    Active member of club or organization: Not on file    Attends meetings of clubs or organizations: Not on file    Relationship status: Not on file  . Intimate partner violence:    Fear of current or ex partner: Not on file    Emotionally abused: Not on file    Physically abused: Not on file    Forced sexual activity: Not on file  Other Topics Concern  . Not on file  Social History Narrative  . Not on file    Past Surgical History:  Procedure Laterality Date  . KNEE SURGERY    . KNEE SURGERY     rt knee surgery.     No family history on file.  Allergies  Allergen Reactions  . Codeine Anaphylaxis    Welts  Welts     Current Outpatient Medications on File Prior to Visit  Medication Sig Dispense Refill  . benzonatate (TESSALON) 100 MG capsule Take  1 capsule (100 mg total) by mouth every 8 (eight) hours. 15 capsule 0  . fluticasone (FLONASE) 50 MCG/ACT nasal spray Place 2 sprays into both nostrils daily. 16 g 3  . levocetirizine (XYZAL) 5 MG tablet Take 1 tablet (5 mg total) by mouth every evening. 30 tablet 3   No current facility-administered medications on file prior to visit.     BP 113/75   Pulse 81   Temp 98.7 F (37.1 C) (Oral)   Resp 16   Ht 5\' 8"  (1.727 m)   Wt 184 lb 3.2 oz (83.6 kg)   SpO2 100%   BMI 28.01 kg/m       Objective:   Physical Exam  General Mental Status- Alert. General Appearance- Not in acute distress.   Skin General: Color- Normal Color. Moisture- Normal Moisture.  Neck Carotid Arteries- Normal color. Moisture- Normal Moisture. No carotid bruits. No JVD.  Chest and Lung Exam Auscultation: Breath  Sounds:-Normal.  Cardiovascular Auscultation:Rythm- Regular. Murmurs & Other Heart Sounds:Auscultation of the heart reveals- No Murmurs.  Abdomen Inspection:-Inspeection Normal. Palpation/Percussion:Note:No mass. Palpation and Percussion of the abdomen reveal- Non Tender, Non Distended + BS, no rebound or guarding.   Neurologic Cranial Nerve exam:- CN III-XII intact(No nystagmus), symmetric smile. Strength:- 5/5 equal and symmetric strength both upper and lower extremities.  Genital exam and rectal- deferred.      Assessment & Plan:  For you wellness exam today I have ordered cbc, cmp, lipid panel, ua and psa.,  Vaccine tdap declined today  Hiv screen declined.  Recommend exercise and healthy diet.  We will let you know lab results as they come in.  Follow up date appointment will be determined after lab review.    Esperanza RichtersEdward Jahara Dail, PA-C

## 2018-07-01 NOTE — Patient Instructions (Addendum)
For you wellness exam today I have ordered cbc, cmp, lipid panel, ua and psa.  Vaccine tdap declined today  Hiv screen declined.  Recommend exercise and healthy diet.  We will let you know lab results as they come in.  Follow up date appointment will be determined after lab review.    Preventive Care 40-64 Years, Male Preventive care refers to lifestyle choices and visits with your health care provider that can promote health and wellness. What does preventive care include?  A yearly physical exam. This is also called an annual well check.  Dental exams once or twice a year.  Routine eye exams. Ask your health care provider how often you should have your eyes checked.  Personal lifestyle choices, including: ? Daily care of your teeth and gums. ? Regular physical activity. ? Eating a healthy diet. ? Avoiding tobacco and drug use. ? Limiting alcohol use. ? Practicing safe sex. ? Taking low-dose aspirin every day starting at age 45. What happens during an annual well check? The services and screenings done by your health care provider during your annual well check will depend on your age, overall health, lifestyle risk factors, and family history of disease. Counseling Your health care provider may ask you questions about your:  Alcohol use.  Tobacco use.  Drug use.  Emotional well-being.  Home and relationship well-being.  Sexual activity.  Eating habits.  Work and work Statistician.  Screening You may have the following tests or measurements:  Height, weight, and BMI.  Blood pressure.  Lipid and cholesterol levels. These may be checked every 5 years, or more frequently if you are over 77 years old.  Skin check.  Lung cancer screening. You may have this screening every year starting at age 70 if you have a 30-pack-year history of smoking and currently smoke or have quit within the past 15 years.  Fecal occult blood test (FOBT) of the stool. You may have  this test every year starting at age 59.  Flexible sigmoidoscopy or colonoscopy. You may have a sigmoidoscopy every 5 years or a colonoscopy every 10 years starting at age 86.  Prostate cancer screening. Recommendations will vary depending on your family history and other risks.  Hepatitis C blood test.  Hepatitis B blood test.  Sexually transmitted disease (STD) testing.  Diabetes screening. This is done by checking your blood sugar (glucose) after you have not eaten for a while (fasting). You may have this done every 1-3 years.  Discuss your test results, treatment options, and if necessary, the need for more tests with your health care provider. Vaccines Your health care provider may recommend certain vaccines, such as:  Influenza vaccine. This is recommended every year.  Tetanus, diphtheria, and acellular pertussis (Tdap, Td) vaccine. You may need a Td booster every 10 years.  Varicella vaccine. You may need this if you have not been vaccinated.  Zoster vaccine. You may need this after age 53.  Measles, mumps, and rubella (MMR) vaccine. You may need at least one dose of MMR if you were born in 1957 or later. You may also need a second dose.  Pneumococcal 13-valent conjugate (PCV13) vaccine. You may need this if you have certain conditions and have not been vaccinated.  Pneumococcal polysaccharide (PPSV23) vaccine. You may need one or two doses if you smoke cigarettes or if you have certain conditions.  Meningococcal vaccine. You may need this if you have certain conditions.  Hepatitis A vaccine. You may need this if  you have certain conditions or if you travel or work in places where you may be exposed to hepatitis A.  Hepatitis B vaccine. You may need this if you have certain conditions or if you travel or work in places where you may be exposed to hepatitis B.  Haemophilus influenzae type b (Hib) vaccine. You may need this if you have certain risk factors.  Talk to your  health care provider about which screenings and vaccines you need and how often you need them. This information is not intended to replace advice given to you by your health care provider. Make sure you discuss any questions you have with your health care provider. Document Released: 11/26/2015 Document Revised: 07/19/2016 Document Reviewed: 08/31/2015 Elsevier Interactive Patient Education  [employer].

## 2018-07-01 NOTE — Telephone Encounter (Signed)
Future urine micro placed.

## 2018-10-28 ENCOUNTER — Encounter (HOSPITAL_BASED_OUTPATIENT_CLINIC_OR_DEPARTMENT_OTHER): Payer: Self-pay

## 2018-10-28 ENCOUNTER — Other Ambulatory Visit: Payer: Self-pay

## 2018-10-28 ENCOUNTER — Emergency Department (HOSPITAL_BASED_OUTPATIENT_CLINIC_OR_DEPARTMENT_OTHER)
Admission: EM | Admit: 2018-10-28 | Discharge: 2018-10-28 | Disposition: A | Discharge status: Home / Self Care | Payer: BLUE CROSS/BLUE SHIELD | Attending: Emergency Medicine | Admitting: Emergency Medicine

## 2018-10-28 DIAGNOSIS — R197 Diarrhea, unspecified: Secondary | ICD-10-CM | POA: Diagnosis not present

## 2018-10-28 DIAGNOSIS — Z87891 Personal history of nicotine dependence: Secondary | ICD-10-CM | POA: Diagnosis not present

## 2018-10-28 DIAGNOSIS — B349 Viral infection, unspecified: Secondary | ICD-10-CM | POA: Insufficient documentation

## 2018-10-28 DIAGNOSIS — R109 Unspecified abdominal pain: Secondary | ICD-10-CM | POA: Diagnosis not present

## 2018-10-28 DIAGNOSIS — R1084 Generalized abdominal pain: Secondary | ICD-10-CM | POA: Diagnosis not present

## 2018-10-28 DIAGNOSIS — Z79899 Other long term (current) drug therapy: Secondary | ICD-10-CM | POA: Diagnosis not present

## 2018-10-28 DIAGNOSIS — R110 Nausea: Secondary | ICD-10-CM | POA: Diagnosis not present

## 2018-10-28 MED ORDER — ACETAMINOPHEN 325 MG PO TABS
650.0000 mg | ORAL_TABLET | Freq: Once | ORAL | Status: AC
  Administered 2018-10-28: 650 mg via ORAL
  Filled 2018-10-28: qty 2

## 2018-10-28 MED ORDER — ONDANSETRON HCL 8 MG PO TABS
4.0000 mg | ORAL_TABLET | Freq: Once | ORAL | Status: AC
  Administered 2018-10-28: 4 mg via ORAL
  Filled 2018-10-28: qty 1

## 2018-10-28 MED ORDER — ONDANSETRON HCL 4 MG PO TABS: 4.0000 mg | ORAL_TABLET | Freq: Four times a day (QID) | ORAL | 0 refills | Status: AC

## 2018-10-28 NOTE — ED Triage Notes (Addendum)
Pt c/o body aches, sweats/chills, diarrhea, nausea that started last PM.

## 2018-10-28 NOTE — ED Notes (Signed)
ED Provider at bedside. 

## 2018-10-28 NOTE — ED Provider Notes (Signed)
[facility] EMERGENCY DEPARTMENT Provider Note   CSN: [id number] Arrival date & time: 10/28/18  1435     History   Chief Complaint Chief Complaint  Patient presents with  . Influenza    HPI Peter Myers is a 47 y.o. male.  The history is provided by the patient.  Abdominal Pain   This is a new problem. The current episode started 1 to 2 hours ago. The problem has been resolved. The pain is associated with suspicious food intake. The pain is located in the generalized abdominal region. The quality of the pain is aching and dull. The pain is at a severity of 1/10. The pain is mild. Associated symptoms include nausea. Pertinent negatives include anorexia, fever, belching, diarrhea, flatus, hematochezia, melena, vomiting, constipation, dysuria, frequency, hematuria, headaches, arthralgias and myalgias. Nothing aggravates the symptoms. Nothing relieves the symptoms. Past workup does not include GI consult. His past medical history does not include ulcerative colitis.    Past Medical History:  Diagnosis Date  . Allergy   . GERD (gastroesophageal reflux disease)    occasional. Not daily.    Patient Active Problem List   Diagnosis Date Noted  . Pain, dental 02/10/2014    Past Surgical History:  Procedure Laterality Date  . KNEE SURGERY    . KNEE SURGERY     rt knee surgery.         Home Medications    Prior to Admission medications   Medication Sig Start Date End Date Taking? Authorizing Provider  benzonatate (TESSALON) 100 MG capsule Take 1 capsule (100 mg total) by mouth every 8 (eight) hours. 03/21/18   Renne Crigler, PA-C  fluticasone (FLONASE) 50 MCG/ACT nasal spray Place 2 sprays into both nostrils daily. 06/24/18   Saguier, Ramon Dredge, PA-C  levocetirizine (XYZAL) 5 MG tablet Take 1 tablet (5 mg total) by mouth every evening. 06/24/18   Saguier, Ramon Dredge, PA-C  ondansetron (ZOFRAN) 4 MG tablet Take 1 tablet (4 mg total) by mouth every 6 (six) hours for 12  doses. 10/28/18 10/31/18  Virgina Norfolk, DO    Family History No family history on file.  Social History Social History   Tobacco Use  . Smoking status: Former Smoker    Packs/day: 0.25    Years: 1.00    Pack years: 0.25    Last attempt to quit: 06/24/2017    Years since quitting: 1.3  . Smokeless tobacco: Never Used  Substance Use Topics  . Alcohol use: Yes    Comment: occsional on weekends. 1-2 beers.  . Drug use: Not Currently     Allergies   Codeine   Review of Systems Review of Systems  Constitutional: Negative for chills and fever.  HENT: Negative for ear pain and sore throat.   Eyes: Negative for pain and visual disturbance.  Respiratory: Negative for cough and shortness of breath.   Cardiovascular: Negative for chest pain and palpitations.  Gastrointestinal: Positive for abdominal pain and nausea. Negative for anorexia, constipation, diarrhea, flatus, hematochezia, melena and vomiting.  Genitourinary: Negative for dysuria, frequency and hematuria.  Musculoskeletal: Negative for arthralgias, back pain and myalgias.  Skin: Negative for color change and rash.  Neurological: Negative for seizures, syncope and headaches.  All other systems reviewed and are negative.    Physical Exam Updated Vital Signs   ED Triage Vitals  Enc Vitals Group     BP 10/28/18 1452 132/89     Pulse Rate 10/28/18 1452 74     Resp 10/28/18  1452 20     Temp 10/28/18 1452 98.6 F (37 C)     Temp Source 10/28/18 1452 Oral     SpO2 10/28/18 1452 97 %     Weight 10/28/18 1454 190 lb (86.2 kg)     Height 10/28/18 1454 5\' 8"  (1.727 m)     Head Circumference --      Peak Flow --      Pain Score 10/28/18 1453 8     Pain Loc --      Pain Edu? --      Excl. in GC? --     Physical Exam Vitals signs and nursing note reviewed.  Constitutional:      Appearance: He is well-developed.  HENT:     Head: Normocephalic and atraumatic.  Eyes:     Extraocular Movements: Extraocular  movements intact.     Conjunctiva/sclera: Conjunctivae normal.     Pupils: Pupils are equal, round, and reactive to light.  Neck:     Musculoskeletal: Normal range of motion and neck supple.  Cardiovascular:     Rate and Rhythm: Normal rate and regular rhythm.     Pulses: Normal pulses.     Heart sounds: Normal heart sounds. No murmur.  Pulmonary:     Effort: Pulmonary effort is normal. No respiratory distress.     Breath sounds: Normal breath sounds.  Abdominal:     General: There is no distension.     Palpations: Abdomen is soft. There is no mass.     Tenderness: There is no abdominal tenderness. There is no guarding.     Hernia: No hernia is present.  Skin:    General: Skin is warm and dry.     Capillary Refill: Capillary refill takes less than 2 seconds.  Neurological:     General: No focal deficit present.     Mental Status: He is alert.  Psychiatric:        Mood and Affect: Mood normal.      ED Treatments / Results  Labs (all labs ordered are listed, but only abnormal results are displayed) Labs Reviewed - No data to display  EKG None  Radiology No results found.  Procedures Procedures (including critical care time)  Medications Ordered in ED Medications  ondansetron (ZOFRAN) tablet 4 mg (4 mg Oral Given 10/28/18 1839)  acetaminophen (TYLENOL) tablet 650 mg (650 mg Oral Given 10/28/18 1838)     Initial Impression / Assessment and Plan / ED Course  I have reviewed the triage vital signs and the nursing notes.  Pertinent labs & imaging results that were available during my care of the patient were reviewed by me and considered in my medical decision making (see chart for details).     Peter Myers is a 47 year old male with no significant medical history who presents to the ED with body aches, nausea, fever, chills.  Patient with unremarkable vitals.  No fever.  Patient overall well-appearing.  Denies any diarrhea, no vomiting.  Patient ate suspicious  food yesterday.  Concern for possible food poisoning.  Possible viral process.  Has clear breath sounds on exam.  No focal abdominal tenderness on exam.  No urinary symptoms.  No concern for appendicitis, pancreatitis.  Denies any alcohol or drug use.  Exam is unremarkable.  Overall suspect patient likely with viral process.  Possible food poisoning.  Given Zofran, Tylenol while in the ED.  Recommend continued hydration.  Recommend continued use of Tylenol, Motrin, Zofran at home.  Given return precautions and discharged in ED good condition.  No concern for dehydration on exam.  This chart was dictated using voice recognition software.  Despite best efforts to proofread,  errors can occur which can change the documentation meaning.   Final Clinical Impressions(s) / ED Diagnoses   Final diagnoses:  Viral syndrome    ED Discharge Orders         Ordered    ondansetron (ZOFRAN) 4 MG tablet  Every 6 hours     10/28/18 1825           Virgina Norfolk, DO 10/29/18 [phone]

## 2018-11-01 ENCOUNTER — Telehealth: Payer: Self-pay

## 2018-11-01 ENCOUNTER — Encounter: Payer: Self-pay | Admitting: Internal Medicine

## 2018-11-01 ENCOUNTER — Ambulatory Visit (INDEPENDENT_AMBULATORY_CARE_PROVIDER_SITE_OTHER): Payer: BLUE CROSS/BLUE SHIELD | Admitting: Internal Medicine

## 2018-11-01 VITALS — BP 116/68 | HR 98 | Temp 98.3°F | Resp 16 | Ht 68.0 in | Wt 194.4 lb

## 2018-11-01 DIAGNOSIS — R197 Diarrhea, unspecified: Secondary | ICD-10-CM

## 2018-11-01 DIAGNOSIS — Z1211 Encounter for screening for malignant neoplasm of colon: Secondary | ICD-10-CM

## 2018-11-01 LAB — CBC WITH DIFFERENTIAL/PLATELET
BASOS ABS: 0 10*3/uL (ref 0.0–0.1)
BASOS PCT: 0.3 % (ref 0.0–3.0)
Eosinophils Absolute: 0.1 10*3/uL (ref 0.0–0.7)
Eosinophils Relative: 1.3 % (ref 0.0–5.0)
HEMATOCRIT: 43.1 % (ref 39.0–52.0)
Hemoglobin: 14.6 g/dL (ref 13.0–17.0)
LYMPHS PCT: 42.8 % (ref 12.0–46.0)
Lymphs Abs: 1.7 10*3/uL (ref 0.7–4.0)
MCHC: 33.7 g/dL (ref 30.0–36.0)
MCV: 94.2 fl (ref 78.0–100.0)
Monocytes Absolute: 0.3 10*3/uL (ref 0.1–1.0)
Monocytes Relative: 7.3 % (ref 3.0–12.0)
Neutro Abs: 1.9 10*3/uL (ref 1.4–7.7)
Neutrophils Relative %: 48.3 % (ref 43.0–77.0)
Platelets: 299 10*3/uL (ref 150.0–400.0)
RBC: 4.58 Mil/uL (ref 4.22–5.81)
RDW: 13 % (ref 11.5–15.5)
WBC: 4 10*3/uL (ref 4.0–10.5)

## 2018-11-01 LAB — COMPREHENSIVE METABOLIC PANEL
ALT: 29 U/L (ref 0–53)
AST: 21 U/L (ref 0–37)
Albumin: 4.4 g/dL (ref 3.5–5.2)
Alkaline Phosphatase: 73 U/L (ref 39–117)
BUN: 12 mg/dL (ref 6–23)
CALCIUM: 9.5 mg/dL (ref 8.4–10.5)
CHLORIDE: 104 meq/L (ref 96–112)
CO2: 28 mEq/L (ref 19–32)
Creatinine, Ser: 1.07 mg/dL (ref 0.40–1.50)
GFR: 94.22 mL/min (ref >60.00)
Glucose, Bld: 118 mg/dL — ABNORMAL HIGH (ref 70–99)
POTASSIUM: 4.1 meq/L (ref 3.5–5.1)
Sodium: 139 mEq/L (ref 135–145)
Total Bilirubin: 0.6 mg/dL (ref 0.2–1.2)
Total Protein: 7.1 g/dL (ref 6.0–8.3)

## 2018-11-01 MED ORDER — DICYCLOMINE HCL 10 MG PO CAPS: 10.0000 mg | ORAL_CAPSULE | Freq: Three times a day (TID) | ORAL | 0 refills | Status: DC

## 2018-11-01 NOTE — Progress Notes (Signed)
Pre visit review using our clinic review tool, if applicable. No additional management support is needed unless otherwise documented below in the visit note. 

## 2018-11-01 NOTE — Telephone Encounter (Signed)
Pt requesting screening cscope.

## 2018-11-01 NOTE — Patient Instructions (Signed)
GO TO THE LAB : Get the blood work.  Bring stool samples when you can particularly if you are not improving in the next 2-3 days .  Drink plenty fluids  Bentyl as needed for abdominal pain  Call if not gradually better  Pepto-Bismol OTC is okay.  Will change the color of her stools    Diarrhea, Adult Diarrhea is frequent loose and watery bowel movements. Diarrhea can make you feel weak and cause you to become dehydrated. Dehydration can make you tired and thirsty, cause you to have a dry mouth, and decrease how often you urinate. Diarrhea typically lasts 2-3 days. However, it can last longer if it is a sign of something more serious. It is important to treat your diarrhea as told by your health care provider. Follow these instructions at home: Eating and drinking     Follow these recommendations as told by your health care provider:  Take an oral rehydration solution (ORS). This is an over-the-counter medicine that helps return your body to its normal balance of nutrients and water. It is found at pharmacies and retail stores.  Drink plenty of fluids, such as water, ice chips, diluted fruit juice, and low-calorie sports drinks. You can drink milk also, if desired.  Avoid drinking fluids that contain a lot of sugar or caffeine, such as energy drinks, sports drinks, and soda.  Eat bland, easy-to-digest foods in small amounts as you are able. These foods include bananas, applesauce, rice, lean meats, toast, and crackers.  Avoid alcohol.  Avoid spicy or fatty foods.  Medicines  Take over-the-counter and prescription medicines only as told by your health care provider.  If you were prescribed an antibiotic medicine, take it as told by your health care provider. Do not stop using the antibiotic even if you start to feel better. General instructions   Wash your hands often using soap and water. If soap and water are not available, use a hand sanitizer. Others in the household  should wash their hands as well. Hands should be washed: ? After using the toilet or changing a diaper. ? Before preparing, cooking, or serving food. ? While caring for a sick person or while visiting someone in a hospital.  Drink enough fluid to keep your urine pale yellow.  Rest at home while you recover.  Watch your condition for any changes.  Take a warm bath to relieve any burning or pain from frequent diarrhea episodes.  Keep all follow-up visits as told by your health care provider. This is important. Contact a health care provider if:  You have a fever.  Your diarrhea gets worse.  You have new symptoms.  You cannot keep fluids down.  You feel light-headed or dizzy.  You have a headache.  You have muscle cramps. Get help right away if:  You have chest pain.  You feel extremely weak or you faint.  You have bloody or black stools or stools that look like tar.  You have severe pain, cramping, or bloating in your abdomen.  You have trouble breathing or you are breathing very quickly.  Your heart is beating very quickly.  Your skin feels cold and clammy.  You feel confused.  You have signs of dehydration, such as: ? Dark urine, very little urine, or no urine. ? Cracked lips. ? Dry mouth. ? Sunken eyes. ? Sleepiness. ? Weakness. Summary  Diarrhea is frequent loose and watery bowel movements. Diarrhea can make you feel weak and cause you to become  dehydrated.  Drink enough fluids to keep your urine pale yellow.  Make sure that you wash your hands after using the toilet. If soap and water are not available, use hand sanitizer.  Contact a health care provider if your diarrhea gets worse or you have new symptoms.  Get help right away if you have signs of dehydration. This information is not intended to replace advice given to you by your health care provider. Make sure you discuss any questions you have with your health care provider. Document Released:  10/20/2002 Document Revised: 04/05/2018 Document Reviewed: 04/05/2018 Elsevier Interactive Patient Education  2019 [employer]

## 2018-11-01 NOTE — Progress Notes (Signed)
Subjective:    Patient ID: Peter Myers, male    DOB: 05/02/1971, 47 y.o.   Peter Myers  DOS:  11/01/2018 Type of visit - description : Acute Patient was seen at the ER 10/28/2018, symptoms started 1 day prior, immediately after he ate at a restaurant: Had diarrhea , body aches, nausea, fever and chills. Abdomen was somewhat tender on exam.  Eventually impression was a viral process and was treated symptomatically with Zofran, Tylenol.  Here because he continue with symptoms: Abdominal pain has actually improved but is still there, described as on and off, sharp, generalized.  Not necessarily described as cramps Appetite somewhat decreased but whenever he eats he has a bowel movement, stools are loose or watery. Some rectal tenesmus.   Review of Systems No blood in the stools.  No major problems with nausea or GERD at this point Denies fevers but he has chills sometimes. Denies any respiratory symptoms except for mild sore throat.  No cough no runny nose. No recent antibiotic intakes No other family members affected  Past Medical History:  Diagnosis Date  . Allergy   . GERD (gastroesophageal reflux disease)    occasional. Not daily.    Past Surgical History:  Procedure Laterality Date  . KNEE SURGERY    . KNEE SURGERY     rt knee surgery.     Social History   Socioeconomic History  . Marital status: Married    Spouse name: Not on file  . Number of children: Not on file  . Years of education: Not on file  . Highest education level: Not on file  Occupational History  . Not on file  Social Needs  . Financial resource strain: Not on file  . Food insecurity:    Worry: Not on file    Inability: Not on file  . Transportation needs:    Medical: Not on file    Non-medical: Not on file  Tobacco Use  . Smoking status: Former Smoker    Packs/day: 0.25    Years: 1.00    Pack years: 0.25    Last attempt to quit: 06/24/2017    Years since quitting: 1.3  .  Smokeless tobacco: Never Used  Substance and Sexual Activity  . Alcohol use: Yes    Comment: occsional on weekends. 1-2 beers.  . Drug use: Not Currently  . Sexual activity: Yes  Lifestyle  . Physical activity:    Days per week: Not on file    Minutes per session: Not on file  . Stress: Not on file  Relationships  . Social connections:    Talks on phone: Not on file    Gets together: Not on file    Attends religious service: Not on file    Active member of club or organization: Not on file    Attends meetings of clubs or organizations: Not on file    Relationship status: Not on file  . Intimate partner violence:    Fear of current or ex partner: Not on file    Emotionally abused: Not on file    Physically abused: Not on file    Forced sexual activity: Not on file  Other Topics Concern  . Not on file  Social History Narrative  . Not on file      Allergies as of 11/01/2018      Reactions   Codeine Anaphylaxis   Welts  Welts       Medication List  Accurate as of November 01, 2018  8:40 AM. Always use your most recent med list.        benzonatate 100 MG capsule Commonly known as:  TESSALON Take 1 capsule (100 mg total) by mouth every 8 (eight) hours.   fluticasone 50 MCG/ACT nasal spray Commonly known as:  FLONASE Place 2 sprays into both nostrils daily.   levocetirizine 5 MG tablet Commonly known as:  XYZAL Take 1 tablet (5 mg total) by mouth every evening.           Objective:   Physical Exam BP 116/68 (BP Location: Right Arm, Patient Position: Sitting, Cuff Size: Normal)   Pulse 98   Temp 98.3 F (36.8 C) (Oral)   Resp 16   Ht 5\' 8"  (1.727 m)   Wt 194 lb 6 oz (88.2 kg)   SpO2 96%   BMI 29.55 kg/m  General:   Well developed, NAD, BMI noted.  HEENT:  Normocephalic . Face symmetric, atraumatic.  Membranes are moist.   Lungs:  CTA B Normal respiratory effort, no intercostal retractions, no accessory muscle use. Heart: RRR,  no murmur.    no pretibial edema bilaterally  Abdomen:  Not distended, soft, tender throughout, mostly at the colonic area.  Good bowel sounds. Skin: Not pale. Not jaundice Neurologic:  alert & oriented X3.  Speech normal, gait appropriate for age and unassisted Psych--  Cognition and judgment appear intact.  Cooperative with normal attention span and concentration.  Behavior appropriate. No anxious or depressed appearing.     Assessment & Plan:    47 year old gentleman, PMH includes GERD, presents with: Diarrhea: Started 5 days ago, associated with diffuse abdominal pain, some tenesmus. Get a CMP, CBC. Stool culture, WBCs, C. difficile. Conservative treatment: Rest, fluids, bland diet, Bentyl. If not better consider antibiotics.

## 2018-11-04 ENCOUNTER — Telehealth: Payer: Self-pay | Admitting: Medical

## 2018-11-04 NOTE — Telephone Encounter (Signed)
Charted in result notes. 

## 2018-11-04 NOTE — Telephone Encounter (Signed)
Copied from CRM [phone]. Topic: Quick Communication - See Telephone Encounter >> Nov 04, 2018 10:44 AM Jens SomMedley, Jennifer A wrote: CRM for notification. See Telephone encounter for: 11/04/18.  Patient is calling back for lab results. Nurse was unavailable. Patient was unable to hold. Please advise

## 2018-12-05 ENCOUNTER — Other Ambulatory Visit: Payer: Self-pay

## 2018-12-05 ENCOUNTER — Encounter (HOSPITAL_BASED_OUTPATIENT_CLINIC_OR_DEPARTMENT_OTHER): Payer: Self-pay | Admitting: *Deleted

## 2018-12-05 ENCOUNTER — Emergency Department (HOSPITAL_BASED_OUTPATIENT_CLINIC_OR_DEPARTMENT_OTHER)
Admission: EM | Admit: 2018-12-05 | Discharge: 2018-12-05 | Disposition: A | Discharge status: Home / Self Care | Attending: Emergency Medicine | Admitting: Emergency Medicine

## 2018-12-05 ENCOUNTER — Emergency Department (HOSPITAL_BASED_OUTPATIENT_CLINIC_OR_DEPARTMENT_OTHER)

## 2018-12-05 DIAGNOSIS — S9031XA Contusion of right foot, initial encounter: Secondary | ICD-10-CM

## 2018-12-05 DIAGNOSIS — Z79899 Other long term (current) drug therapy: Secondary | ICD-10-CM | POA: Diagnosis not present

## 2018-12-05 DIAGNOSIS — Y9389 Activity, other specified: Secondary | ICD-10-CM | POA: Insufficient documentation

## 2018-12-05 DIAGNOSIS — W228XXA Striking against or struck by other objects, initial encounter: Secondary | ICD-10-CM | POA: Diagnosis not present

## 2018-12-05 DIAGNOSIS — S99921A Unspecified injury of right foot, initial encounter: Secondary | ICD-10-CM | POA: Diagnosis present

## 2018-12-05 DIAGNOSIS — Y9289 Other specified places as the place of occurrence of the external cause: Secondary | ICD-10-CM | POA: Diagnosis not present

## 2018-12-05 DIAGNOSIS — Y990 Civilian activity done for income or pay: Secondary | ICD-10-CM | POA: Insufficient documentation

## 2018-12-05 DIAGNOSIS — M79671 Pain in right foot: Secondary | ICD-10-CM | POA: Diagnosis not present

## 2018-12-05 DIAGNOSIS — Z87891 Personal history of nicotine dependence: Secondary | ICD-10-CM | POA: Diagnosis not present

## 2018-12-05 MED ORDER — BACITRACIN ZINC 500 UNIT/GM EX OINT
TOPICAL_OINTMENT | Freq: Once | CUTANEOUS | Status: AC
  Administered 2018-12-05: 15:00:00 via TOPICAL
  Filled 2018-12-05: qty 28.35

## 2018-12-05 MED ORDER — HYDROCODONE-ACETAMINOPHEN 5-325 MG PO TABS
2.0000 | ORAL_TABLET | Freq: Once | ORAL | Status: AC
  Administered 2018-12-05: 2 via ORAL
  Filled 2018-12-05: qty 2

## 2018-12-05 NOTE — Discharge Instructions (Signed)
You can take Tylenol or Ibuprofen as directed for pain. You can alternate Tylenol and Ibuprofen every 4 hours. If you take Tylenol at 1pm, then you can take Ibuprofen at 5pm. Then you can take Tylenol again at 9pm.   Follow the RICE (Rest, Ice, Compression, Elevation) protocol as directed.   Return the emergency department for any worsening pain, redness or swelling of the foot, numbness/weakness of the foot or any other worsening or concerning symptoms.

## 2018-12-05 NOTE — ED Notes (Signed)
ED Provider at bedside. 

## 2018-12-05 NOTE — ED Provider Notes (Signed)
[facility] EMERGENCY DEPARTMENT Provider Note   CSN: [id number] Arrival date & time: 12/05/18  1358     History   Chief Complaint Chief Complaint  Patient presents with  . Foot Injury    HPI Peter Myers is a 48 y.o. male with past medical history of GERD who presents for evaluation of right foot pain that began after an object fell on it this morning at about 9 AM.  He reports that while at work, he a steel dye fell from a tall distance and landed on the dorsal aspect of his right foot.  He states that the dye is about 6 inches in length in diameter.  He states that he was wearing shoes at the time.  He states that he has still been able to walk on it throughout the day but does report worsening pain throughout the day prompting ED visit.  Patient reports he does not take any medication for the pain.  Patient denies any numbness/weakness.  He states that his tetanus is up-to-date.  The history is provided by the patient.    Past Medical History:  Diagnosis Date  . Allergy   . GERD (gastroesophageal reflux disease)    occasional. Not daily.    Patient Active Problem List   Diagnosis Date Noted  . Pain, dental 02/10/2014    Past Surgical History:  Procedure Laterality Date  . KNEE SURGERY    . KNEE SURGERY     rt knee surgery.         Home Medications    Prior to Admission medications   Medication Sig Start Date End Date Taking? Authorizing Provider  benzonatate (TESSALON) 100 MG capsule Take 1 capsule (100 mg total) by mouth every 8 (eight) hours. 03/21/18   Renne CriglerGeiple, Joshua, PA-C  dicyclomine (BENTYL) 10 MG capsule Take 1-2 capsules (10-20 mg total) by mouth 4 (four) times daily -  before meals and at bedtime. 11/01/18   Wanda PlumpPaz, Jose E, MD  fluticasone [facility](FLONASE) 50 MCG/ACT nasal spray Place 2 sprays into both nostrils daily. 06/24/18   Saguier, Ramon DredgeEdward, PA-C  levocetirizine (XYZAL) 5 MG tablet Take 1 tablet (5 mg total) by mouth every evening. 06/24/18    Saguier, Ramon DredgeEdward, PA-C    Family History No family history on file.  Social History Social History   Tobacco Use  . Smoking status: Former Smoker    Packs/day: 0.25    Years: 1.00    Pack years: 0.25    Last attempt to quit: 06/24/2017    Years since quitting: 1.4  . Smokeless tobacco: Never Used  Substance Use Topics  . Alcohol use: Yes    Comment: occsional on weekends. 1-2 beers.  . Drug use: Not Currently     Allergies   Codeine   Review of Systems Review of Systems  Musculoskeletal:       Right foot pain  Neurological: Negative for weakness and numbness.  All other systems reviewed and are negative.    Physical Exam Updated Vital Signs BP 126/87   Pulse 87   Temp 98.2 F (36.8 C) (Oral)   Resp 18   Ht 5\' 9"  (1.753 m)   Wt 83.9 kg   SpO2 100%   BMI 27.32 kg/m   Physical Exam Vitals signs and nursing note reviewed.  Constitutional:      Appearance: He is well-developed.  HENT:     Head: Normocephalic and atraumatic.  Eyes:     General: No scleral icterus.  Right eye: No discharge.        Left eye: No discharge.     Conjunctiva/sclera: Conjunctivae normal.  Cardiovascular:     Pulses:          Dorsalis pedis pulses are 2+ on the left side.     Comments: Difficulty assessing palpable DP pulse on right foot secondary to ability to cooperate, tenderness and soft tissue swelling.  Good DP pulse on the right foot with doppler.  Pulmonary:     Effort: Pulmonary effort is normal.  Musculoskeletal:     Comments: Tenderness palpation noted to the distal dorsal aspect of the right foot with overlying soft tissue swelling and ecchymosis.  Forming or crepitus noted.  Limited range of motion of foot and toes secondary to pain.  No palpable deficits noted on Achilles tendon.  No tenderness palpation noted to lateral or medial malleolus of right ankle.  Compartments are soft. No tenderness to palpation noted to the left foot.   Skin:    General: Skin is  warm and dry.     Capillary Refill: Capillary refill takes less than 2 seconds.     Comments: Small pinpoint abrasion noted to the dorsal aspect of the right foot just proximal to the second and third phalanxes.  No obvious laceration. Good distal cap refill. RLE is not dusky in appearance or cool to touch.  Neurological:     Mental Status: He is alert.  Psychiatric:        Speech: Speech normal.        Behavior: Behavior normal.      ED Treatments / Results  Labs (all labs ordered are listed, but only abnormal results are displayed) Labs Reviewed - No data to display  EKG None  Radiology Dg Foot Complete Right  Result Date: 12/05/2018 CLINICAL DATA:  Pain after trauma EXAM: RIGHT FOOT COMPLETE - 3+ VIEW COMPARISON:  None. FINDINGS: There is no evidence of fracture or dislocation. There is no evidence of arthropathy or other focal bone abnormality. Soft tissues are unremarkable. IMPRESSION: Negative. Electronically Signed   By: Gerome Sam III M.D   On: 12/05/2018 14:46    Procedures Procedures (including critical care time)  Medications Ordered in ED Medications  HYDROcodone-acetaminophen (NORCO/VICODIN) 5-325 MG per tablet 2 tablet (2 tablets Oral Given 12/05/18 1431)  bacitracin ointment ( Topical Given 12/05/18 1459)     Initial Impression / Assessment and Plan / ED Course  I have reviewed the triage vital signs and the nursing notes.  Pertinent labs & imaging results that were available during my care of the patient were reviewed by me and considered in my medical decision making (see chart for details).     48 year old male who presents for evaluation of right foot pain after an injury at work that occurred this morning at 9 AM.  Does report that he has been able to walk on it since then but reports worsening pain doing so.  Tetanus is up-to-date. Patient is afebrile, non-toxic appearing, sitting comfortably on examination table. Vital signs reviewed and stable.   Patient is neurovascularly intact.  Concern for fracture versus dislocation versus contusion.  X-rays ordered at triage.  Foot x-ray shows no evidence of fracture or dislocation.  No evidence of any other focal bone abnormality.  Discussed results with patient.  Plan to put him in a postop shoe. At this time, patient exhibits no emergent life-threatening condition that require further evaluation in ED or admission. Patient had ample opportunity  for questions and discussion. All patient's questions were answered with full understanding. Strict return precautions discussed. Patient expresses understanding and agreement to plan.   Portions of this note were generated with Scientist, clinical (histocompatibility and immunogenetics)Dragon dictation software. Dictation errors may occur despite best attempts at proofreading.   Final Clinical Impressions(s) / ED Diagnoses   Final diagnoses:  Contusion of right foot, initial encounter    ED Discharge Orders    None       Rosana HoesLayden, Lindsey A, PA-C 12/05/18 1623    Gwyneth SproutPlunkett, Whitney, MD 12/11/18 1715

## 2018-12-05 NOTE — ED Triage Notes (Signed)
He dropped a steel plate on his right foot this am at work. UDS is required.

## 2018-12-05 NOTE — ED Notes (Signed)
NAD at this time. Pt is stable and going home.  

## 2018-12-05 NOTE — ED Notes (Signed)
Patient transported to X-ray 

## 2018-12-09 ENCOUNTER — Encounter: Payer: Self-pay | Admitting: Medical

## 2018-12-09 ENCOUNTER — Ambulatory Visit (INDEPENDENT_AMBULATORY_CARE_PROVIDER_SITE_OTHER): Payer: BLUE CROSS/BLUE SHIELD | Admitting: Medical

## 2018-12-09 VITALS — BP 120/71 | HR 91 | Temp 98.2°F | Resp 16 | Ht 68.0 in | Wt 195.2 lb

## 2018-12-09 DIAGNOSIS — L089 Local infection of the skin and subcutaneous tissue, unspecified: Secondary | ICD-10-CM

## 2018-12-09 DIAGNOSIS — Z23 Encounter for immunization: Secondary | ICD-10-CM

## 2018-12-09 DIAGNOSIS — M79671 Pain in right foot: Secondary | ICD-10-CM

## 2018-12-09 DIAGNOSIS — T148XXA Other injury of unspecified body region, initial encounter: Secondary | ICD-10-CM

## 2018-12-09 MED ORDER — DICLOFENAC SODIUM 75 MG PO TBEC: 75.0000 mg | DELAYED_RELEASE_TABLET | Freq: Two times a day (BID) | ORAL | 0 refills | Status: DC

## 2018-12-09 MED ORDER — DOXYCYCLINE HYCLATE 100 MG PO TABS: 100.0000 mg | ORAL_TABLET | Freq: Two times a day (BID) | ORAL | 0 refills | Status: DC

## 2018-12-09 MED FILL — DICLOFENAC SODIUM 75 MG TAB: 75 | 10 days supply | Qty: 20 | Fill #0

## 2018-12-09 MED FILL — DOXYCYCLINE HYCLATE 100 MG: 100 | 7 days supply | Qty: 14 | Fill #0

## 2018-12-09 NOTE — Patient Instructions (Signed)
For your recent right foot pain after trauma/accident, I am going to prescribe you diclofenac and recommend that you continue to use your current orthopedic walking shoe.  You do have small abrasion on top of your foot with some surrounding swelling and tenderness to palpation.  Since there is no recent fracture on x-ray I have concern that the tenderness might be due to secondary skin infection subsequent to the trauma.  I will give you tetanus update today.  Also prescribing doxycycline antibiotic.  Rx advisement given.  Work note given to you and attempted to fax it to your HR department.  If Thursday at the area still swollen then will repeat x-ray of your foot as sometimes initial x-ray post injury might not show tiny early fractures.  Follow-up on Thursday or as needed.

## 2018-12-09 NOTE — Progress Notes (Signed)
Subjective:    Patient ID: Peter Myers, male    DOB: 1971-09-08, 48 y.o.   MRN: [id number]  HPI   Pt in states he dropped some dye on his foot. He states he states dye fell off a shelf at work and landed on his foot. He went to the emergency dept.  The dye comes in cylinder about 1 foot long and weighs about 20 lbs. He states fell distance of about 6 ft estimation.   Pt had xray of foot was negative in ED.  Pt states foot is still swollen. Bottom of his foot is sore.  Pt works for [employer]. Hurts for him to work. Pt states work is aware he is being seen.  Accident happened on Thursday morning.     Review of Systems  Constitutional: Negative for chills, fatigue and fever.  Respiratory: Negative for cough, chest tightness, shortness of breath and wheezing.   Cardiovascular: Negative for chest pain and palpitations.  Gastrointestinal: Negative for abdominal pain, constipation, nausea and vomiting.  Musculoskeletal: Negative for back pain.       Foot pain.  Skin: Negative for pallor and rash.  Neurological: Negative for dizziness, seizures, weakness, numbness and headaches.  Hematological: Negative for adenopathy. Does not bruise/bleed easily.  Psychiatric/Behavioral: Negative for behavioral problems and confusion.    Past Medical History:  Diagnosis Date  . Allergy   . GERD (gastroesophageal reflux disease)    occasional. Not daily.     Social History   Socioeconomic History  . Marital status: Married    Spouse name: Not on file  . Number of children: Not on file  . Years of education: Not on file  . Highest education level: Not on file  Occupational History  . Not on file  Social Needs  . Financial resource strain: Not on file  . Food insecurity:    Worry: Not on file    Inability: Not on file  . Transportation needs:    Medical: Not on file    Non-medical: Not on file  Tobacco Use  . Smoking status: Former Smoker    Packs/day: 0.25    Years: 1.00      Pack years: 0.25    Last attempt to quit: 06/24/2017    Years since quitting: 1.4  . Smokeless tobacco: Never Used  Substance and Sexual Activity  . Alcohol use: Yes    Comment: occsional on weekends. 1-2 beers.  . Drug use: Not Currently  . Sexual activity: Yes  Lifestyle  . Physical activity:    Days per week: Not on file    Minutes per session: Not on file  . Stress: Not on file  Relationships  . Social connections:    Talks on phone: Not on file    Gets together: Not on file    Attends religious service: Not on file    Active member of club or organization: Not on file    Attends meetings of clubs or organizations: Not on file    Relationship status: Not on file  . Intimate partner violence:    Fear of current or ex partner: Not on file    Emotionally abused: Not on file    Physically abused: Not on file    Forced sexual activity: Not on file  Other Topics Concern  . Not on file  Social History Narrative  . Not on file    Past Surgical History:  Procedure Laterality Date  . KNEE SURGERY    .  KNEE SURGERY     rt knee surgery.     No family history on file.  Allergies  Allergen Reactions  . Codeine Anaphylaxis    Welts  Welts     Current Outpatient Medications on File Prior to Visit  Medication Sig Dispense Refill  . benzonatate (TESSALON) 100 MG capsule Take 1 capsule (100 mg total) by mouth every 8 (eight) hours. 15 capsule 0  . fluticasone (FLONASE) 50 MCG/ACT nasal spray Place 2 sprays into both nostrils daily. 16 g 3  . levocetirizine (XYZAL) 5 MG tablet Take 1 tablet (5 mg total) by mouth every evening. 30 tablet 3   No current facility-administered medications on file prior to visit.     BP 120/71   Pulse 91   Temp 98.2 F (36.8 C) (Oral)   Resp 16   Ht 5\' 8"  (1.727 m)   Wt 195 lb 3.2 oz (88.5 kg)   SpO2 99%   BMI 29.68 kg/m       Objective:   Physical Exam  General- No acute distress. Pleasant patient. Neck- Full range of motion,  no jvd Lungs- Clear, even and unlabored. Heart- regular rate and rhythm. Neurologic- CNII- XII grossly intact.  Rt foot- distal aspect swollen. Base of 2nd, 3rd and 4th toe. Small abrasion in area in top where swollen. No warmth. But very tender. No lesion on bottom of foot but some distal metatarsal tenderness.      Assessment & Plan:  Fax [phone].  Pat  For your recent right foot pain after trauma/accident, I am going to prescribe you diclofenac and recommend that you continue to use your current orthopedic walking shoe.  You do have small abrasion on top of your foot with some surrounding swelling and tenderness to palpation.  Since there is no recent fracture on x-ray I have concern that the tenderness might be due to secondary skin infection subsequent to the trauma.  I will give you tetanus update today.  Also prescribing doxycycline antibiotic.  Rx advisement given.  Work note given to you and attempted to fax it to your HR department.  If Thursday at the area still swollen then will repeat x-ray of your foot as sometimes initial x-ray post injury might not show tiny early fractures.  Follow-up on Thursday or as needed.

## 2018-12-12 ENCOUNTER — Encounter: Payer: Self-pay | Admitting: Medical

## 2018-12-12 ENCOUNTER — Ambulatory Visit (INDEPENDENT_AMBULATORY_CARE_PROVIDER_SITE_OTHER): Payer: BLUE CROSS/BLUE SHIELD | Admitting: Medical

## 2018-12-12 VITALS — BP 113/73 | HR 89 | Temp 98.3°F | Resp 16 | Ht 68.0 in | Wt 192.8 lb

## 2018-12-12 DIAGNOSIS — M79671 Pain in right foot: Secondary | ICD-10-CM | POA: Diagnosis not present

## 2018-12-12 NOTE — Progress Notes (Signed)
Subjective:    Patient ID: DYLEN MORNING, male    DOB: 1970/12/20, 48 y.o.   MRN: [id number]  HPI  Pt in for follow up.  Pt states his swelling of rt foot did go down with antibiotic. But he still has pain on bottom of his foot.   He states has only minimally less pain presently after starting the antibiotic. But he does note some improvement. But bottom of foot still in pain.  Pt had work injury but also had abrasion. I gave doxy in order to treat potential skin infection following the trauma and abrasion.  Pt unable to work presently due to pain on ambulation. Pt does not have sedentary work.  Accident happened last Thursday. Pain is still present. More on bottom.    Review of Systems  Constitutional: Negative for chills, fatigue and fever.  Respiratory: Negative for cough, chest tightness, wheezing and stridor.   Cardiovascular: Negative for chest pain and palpitations.  Gastrointestinal: Negative for abdominal pain.  Musculoskeletal:       See hpi.  Skin: Negative for rash.  Hematological: Negative for adenopathy. Does not bruise/bleed easily.    Past Medical History:  Diagnosis Date  . Allergy   . GERD (gastroesophageal reflux disease)    occasional. Not daily.     Social History   Socioeconomic History  . Marital status: Married    Spouse name: Not on file  . Number of children: Not on file  . Years of education: Not on file  . Highest education level: Not on file  Occupational History  . Not on file  Social Needs  . Financial resource strain: Not on file  . Food insecurity:    Worry: Not on file    Inability: Not on file  . Transportation needs:    Medical: Not on file    Non-medical: Not on file  Tobacco Use  . Smoking status: Former Smoker    Packs/day: 0.25    Years: 1.00    Pack years: 0.25    Last attempt to quit: 06/24/2017    Years since quitting: 1.4  . Smokeless tobacco: Never Used  Substance and Sexual Activity  . Alcohol use: Yes      Comment: occsional on weekends. 1-2 beers.  . Drug use: Not Currently  . Sexual activity: Yes  Lifestyle  . Physical activity:    Days per week: Not on file    Minutes per session: Not on file  . Stress: Not on file  Relationships  . Social connections:    Talks on phone: Not on file    Gets together: Not on file    Attends religious service: Not on file    Active member of club or organization: Not on file    Attends meetings of clubs or organizations: Not on file    Relationship status: Not on file  . Intimate partner violence:    Fear of current or ex partner: Not on file    Emotionally abused: Not on file    Physically abused: Not on file    Forced sexual activity: Not on file  Other Topics Concern  . Not on file  Social History Narrative  . Not on file    Past Surgical History:  Procedure Laterality Date  . KNEE SURGERY    . KNEE SURGERY     rt knee surgery.     No family history on file.  Allergies  Allergen Reactions  . Codeine Anaphylaxis  Welts  Welts     Current Outpatient Medications on File Prior to Visit  Medication Sig Dispense Refill  . benzonatate (TESSALON) 100 MG capsule Take 1 capsule (100 mg total) by mouth every 8 (eight) hours. 15 capsule 0  . diclofenac (VOLTAREN) 75 MG EC tablet Take 1 tablet (75 mg total) by mouth 2 (two) times daily. 20 tablet 0  . doxycycline (VIBRA-TABS) 100 MG tablet Take 1 tablet (100 mg total) by mouth 2 (two) times daily. Can give caps or generic 14 tablet 0  . fluticasone (FLONASE) 50 MCG/ACT nasal spray Place 2 sprays into both nostrils daily. 16 g 3  . levocetirizine (XYZAL) 5 MG tablet Take 1 tablet (5 mg total) by mouth every evening. 30 tablet 3   No current facility-administered medications on file prior to visit.     BP 113/73   Pulse 89   Temp 98.3 F (36.8 C) (Oral)   Resp 16   Ht 5\' 8"  (1.727 m)   Wt 192 lb 12.8 oz (87.5 kg)   SpO2 100%   BMI 29.32 kg/m       Objective:   Physical  Exam  General- No acute distress. Pleasant patient.  Rt foot- swelling on top of foot is less. Skin is not red. Abrasion looks clear and healing. He has less pain now on palpation top aspect/metatarsal.  Bottom of foot distal metatarsal very tender to light tough. No skin breakdown. No warmth or induration.       Assessment & Plan:  Your right foot pain has only improved minimally with doxycycline.  He had a small abrasion present then I thought you might of had some skin infection component as well as the injury/trauma.  You report some improvement so would recommend that you continue with the doxycycline.  I do think at this point it would be best to get opinion from podiatrist in light of the fact that pain is severe despite negative x-ray.  Considering getting x-ray today in our office but decided to defer a potential repeat x-ray to be done by podiatrist.  Other studies might be needed as well.  Continue with diclofenac.  Continue with your orthopedic shoe as well.  I am trying to get you in with podiatrist office this coming Monday or Tuesday.  Recommend that you continue not to work in light of the severe pain.  You mentioned that human resources is feeling of paperwork regarding the injury.  I am giving you a letter today notifying them that I have plans to refer you to podiatrist.  I would recommend that you get confirmation that they agree with referral  Follow-up date to be determined.  If you do not get appointment with podiatrist by early next week then please let me know.  Esperanza Richters, PA-C

## 2018-12-12 NOTE — Patient Instructions (Signed)
Your right foot pain has only improved minimally with doxycycline.  He had a small abrasion present then I thought you might of had some skin infection component as well as the injury/trauma.  You report some improvement so would recommend that you continue with the doxycycline.  I do think at this point it would be best to get opinion from podiatrist in light of the fact that pain is severe despite negative x-ray.  Considering getting x-ray today in our office but decided to defer a potential repeat x-ray to be done by podiatrist.  Other studies might be needed as well.  Continue with diclofenac.  Continue with your orthopedic shoe as well.  I am trying to get you in with podiatrist office this coming Monday or Tuesday.  Recommend that you continue not to work in light of the severe pain.  You mentioned that human resources is feeling of paperwork regarding the injury.  I am giving you a letter today notifying them that I have plans to refer you to podiatrist.  I would recommend that you get confirmation that they agree with referral  Follow-up date to be determined.  If you do not get appointment with podiatrist by early next week then please let me know.

## 2018-12-16 ENCOUNTER — Telehealth: Payer: Self-pay | Admitting: *Deleted

## 2018-12-16 NOTE — Telephone Encounter (Signed)
Will you check on referral status. Want pt to be seen today, Tuesday or Wednesday. Pt calling for update.  Thanks, Ramon Dredge

## 2018-12-16 NOTE — Telephone Encounter (Signed)
Copied from CRM [id number]. Topic: Referral - Status >> Dec 16, 2018  9:49 AM Wyonia Hough E wrote: Reason for CRM: Pt called to check on the podiatry referral status or provider/ advised Pt it was being worked on. / please advise

## 2018-12-17 NOTE — Telephone Encounter (Signed)
Pt does not have VM set up, so I am unable to contact the pt.

## 2018-12-17 NOTE — Telephone Encounter (Signed)
Thanks for the update on foot referall. Is pt aware of the contact number. If not will you call and give him the number?

## 2018-12-17 NOTE — Telephone Encounter (Signed)
Referral has been placed to [facility], pt can contact them directly to check the status of the referral [phone]

## 2018-12-17 NOTE — Telephone Encounter (Signed)
Would you mind helping out and typing letter advising pt he needs to call for  podiatrist office. Give him the  podiatrist call back number.Explain our staff has been trying to call him but unable to contact. Podiatrist will have same difficulty.

## 2018-12-23 ENCOUNTER — Other Ambulatory Visit: Payer: Self-pay | Admitting: Medical

## 2018-12-23 ENCOUNTER — Telehealth: Payer: Self-pay | Admitting: Medical

## 2018-12-23 MED ORDER — DICLOFENAC SODIUM 75 MG PO TBEC: 75.0000 mg | DELAYED_RELEASE_TABLET | Freq: Two times a day (BID) | ORAL | 0 refills | Status: AC

## 2018-12-23 NOTE — Telephone Encounter (Signed)
Copied from CRM [id number]. Topic: Quick Communication - Rx Refill/Question >> Dec 23, 2018 11:30 AM Jilda Roche wrote: Medication: diclofenac (VOLTAREN) 75 MG EC tablet   Has the patient contacted their pharmacy? Yes.   (Agent: If no, request that the patient contact the pharmacy for the refill.) (Agent: If yes, when and what did the pharmacy advise?) Call office  Preferred Pharmacy (with phone number or street name): [facility] - [place], [place] - [address] [phone] (Phone) [phone] (Fax)    Agent: Please be advised that RX refills may take up to 3 business days. We ask that you follow-up with your pharmacy.

## 2018-12-23 NOTE — Telephone Encounter (Signed)
Requested medication (s) are due for refill today -no- short term Rx  Requested medication (s) are on the active medication list -yes  Future visit scheduled -no  Last refill: 12/09/18  Notes to clinic: Patient is requesting a refill of medication that passed protocol- but was given short term. Sent to provider for review    Requested Prescriptions  Pending Prescriptions Disp Refills   diclofenac (VOLTAREN) 75 MG EC tablet 20 tablet 0    Sig: Take 1 tablet (75 mg total) by mouth 2 (two) times daily.     Analgesics:  NSAIDS Passed - 12/23/2018 12:56 PM      Passed - Cr in normal range and within 360 days    Creatinine, Ser  Date Value Ref Range Status  11/01/2018 1.07 0.40 - 1.50 mg/dL Final         Passed - HGB in normal range and within 360 days    Hemoglobin  Date Value Ref Range Status  11/01/2018 14.6 13.0 - 17.0 g/dL Final         Passed - Patient is not pregnant      Passed - Valid encounter within last 12 months    Recent Outpatient Visits          1 week ago Right foot pain   [facility] [place], [place], [place]   2 weeks ago Right foot pain   [facility] [place], [place], PA-C   1 month ago Diarrhea of presumed infectious origin   [facility] [place], [place] E, [place]   5 months ago Wellness examination   [facility] [place], [place], PA-C   6 months ago Allergic rhinitis, unspecified seasonality, unspecified Engineer, manufacturingtrigger   [facility] [place], [place], [place]              Requested Prescriptions  Pending Prescriptions Disp Refills   diclofenac (VOLTAREN) 75 MG EC tablet 20 tablet 0    Sig: Take 1 tablet (75 mg total) by mouth 2 (two) times daily.     Analgesics:  NSAIDS Passed - 12/23/2018 12:56 PM      Passed - Cr in normal range and within 360 days    Creatinine,  Ser  Date Value Ref Range Status  11/01/2018 1.07 0.40 - 1.50 mg/dL Final         Passed - HGB in normal range and within 360 days    Hemoglobin  Date Value Ref Range Status  11/01/2018 14.6 13.0 - 17.0 g/dL Final         Passed - Patient is not pregnant      Passed - Valid encounter within last 12 months    Recent Outpatient Visits          1 week ago Right foot pain   [facility] [place], [place], [place]   2 weeks ago Right foot pain   [facility] [place], [place], PA-C   1 month ago Diarrhea of presumed infectious origin   [facility] [place], [place] E, [place]   5 months ago Wellness examination   [facility] [place], [place], PA-C   6 months ago Allergic rhinitis, unspecified seasonality, unspecified Engineer, manufacturingtrigger   [facility] [employer], [place], [place]

## 2018-12-23 NOTE — Telephone Encounter (Signed)
Will you give me update on pt podiatrist referral?

## 2018-12-24 MED FILL — DICLOFENAC SODIUM 75 MG TAB: 75 | 10 days supply | Qty: 20 | Fill #0

## 2019-01-21 ENCOUNTER — Encounter: Payer: Self-pay | Admitting: Medical

## 2019-04-09 ENCOUNTER — Ambulatory Visit: Payer: Self-pay | Admitting: Medical

## 2019-08-15 IMAGING — DX DG FOOT COMPLETE 3+V*R*
3 series · 3 of 3 positions shown · non-contrast
Comparison: None.

CLINICAL DATA: Pain after trauma

EXAM:
RIGHT FOOT COMPLETE - 3+ VIEW

[foot ap]
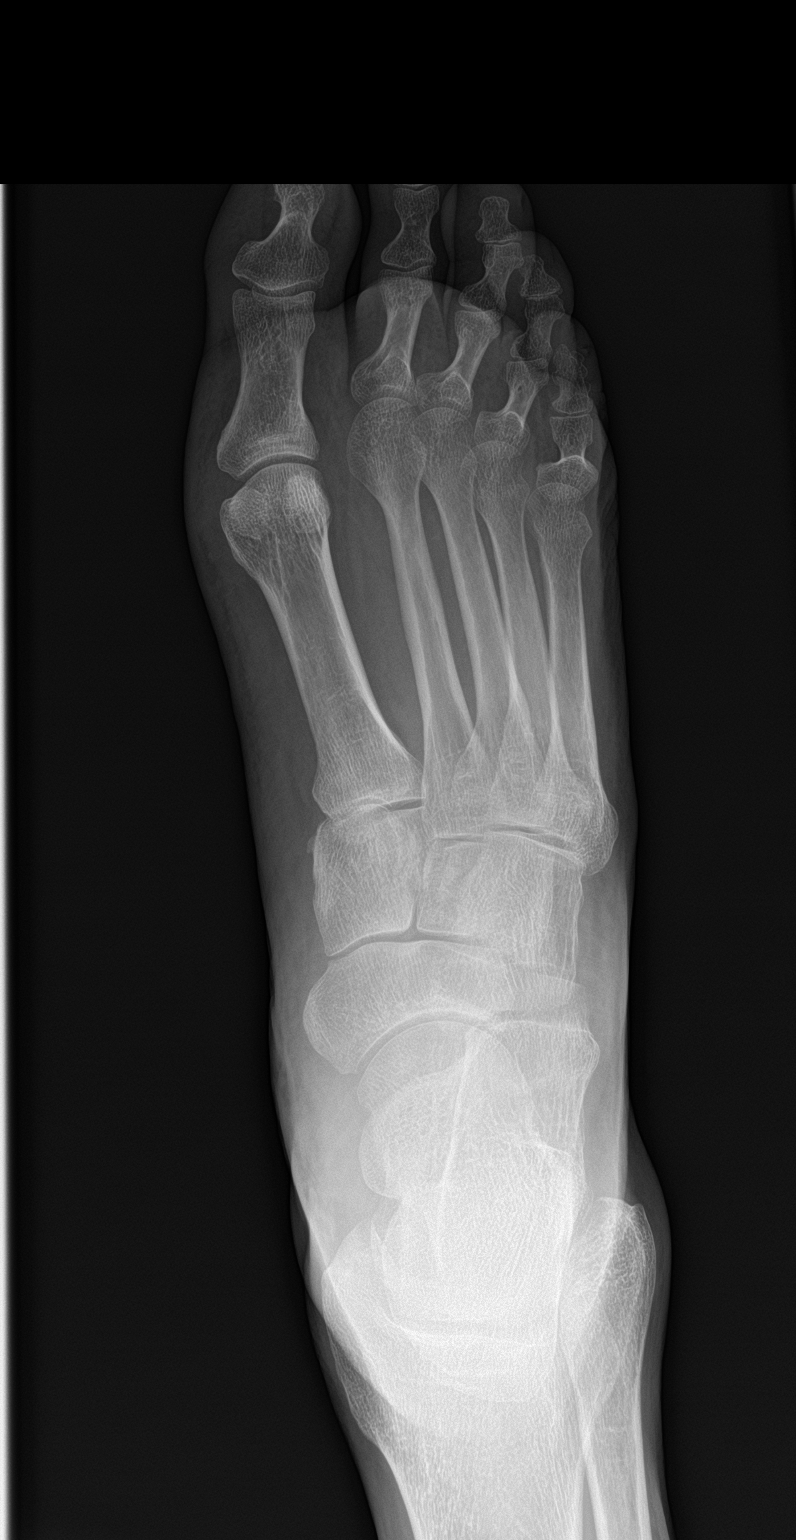

[foot obl]
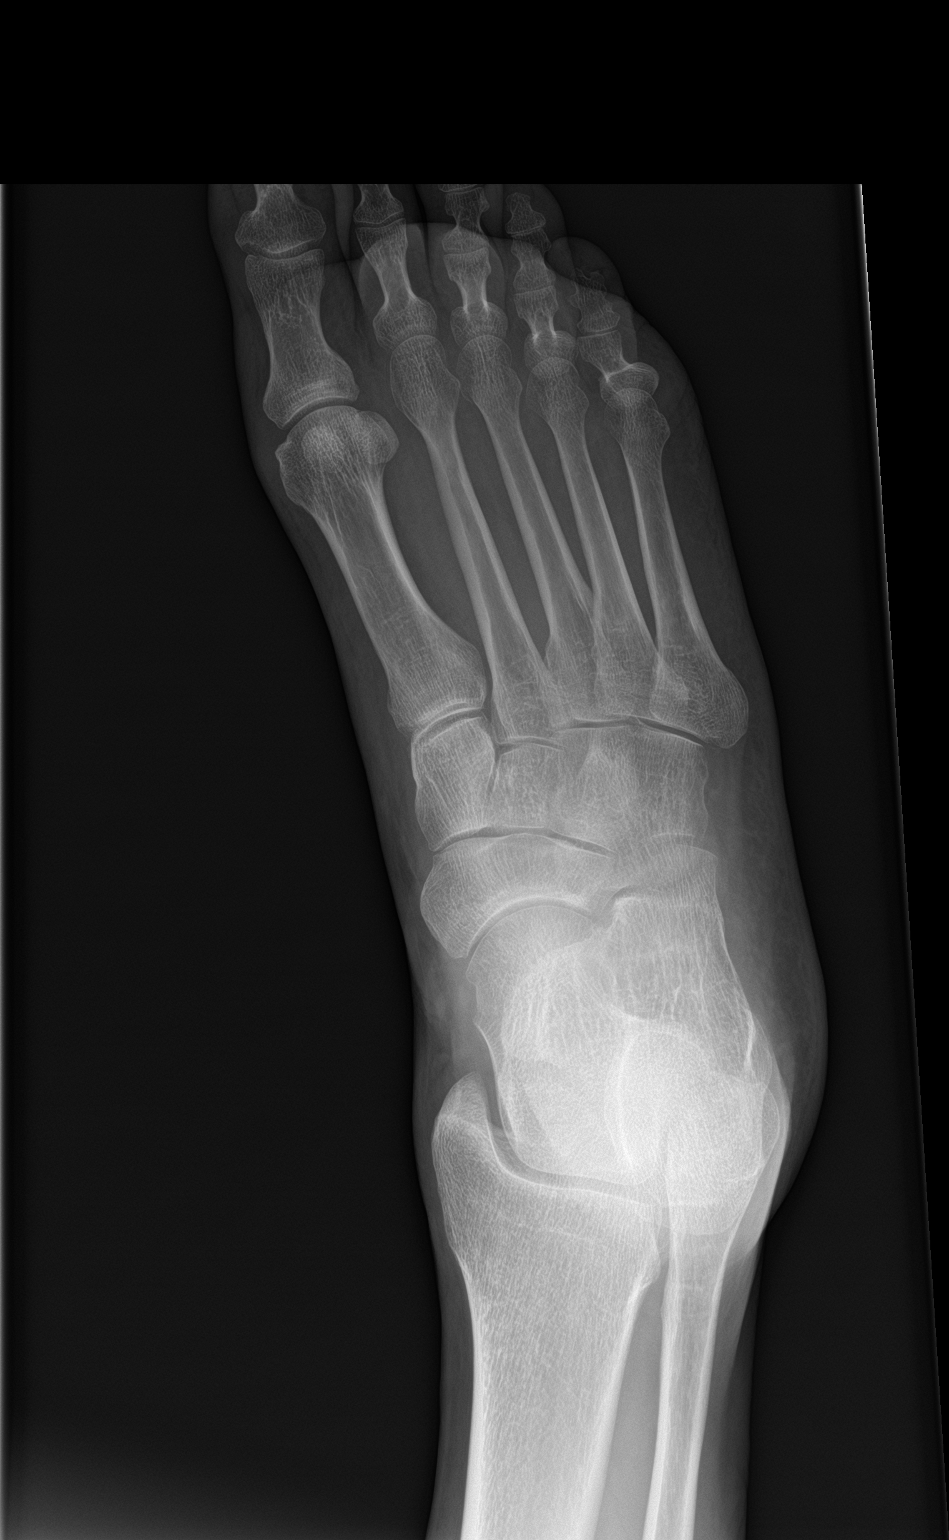

[foot lat]
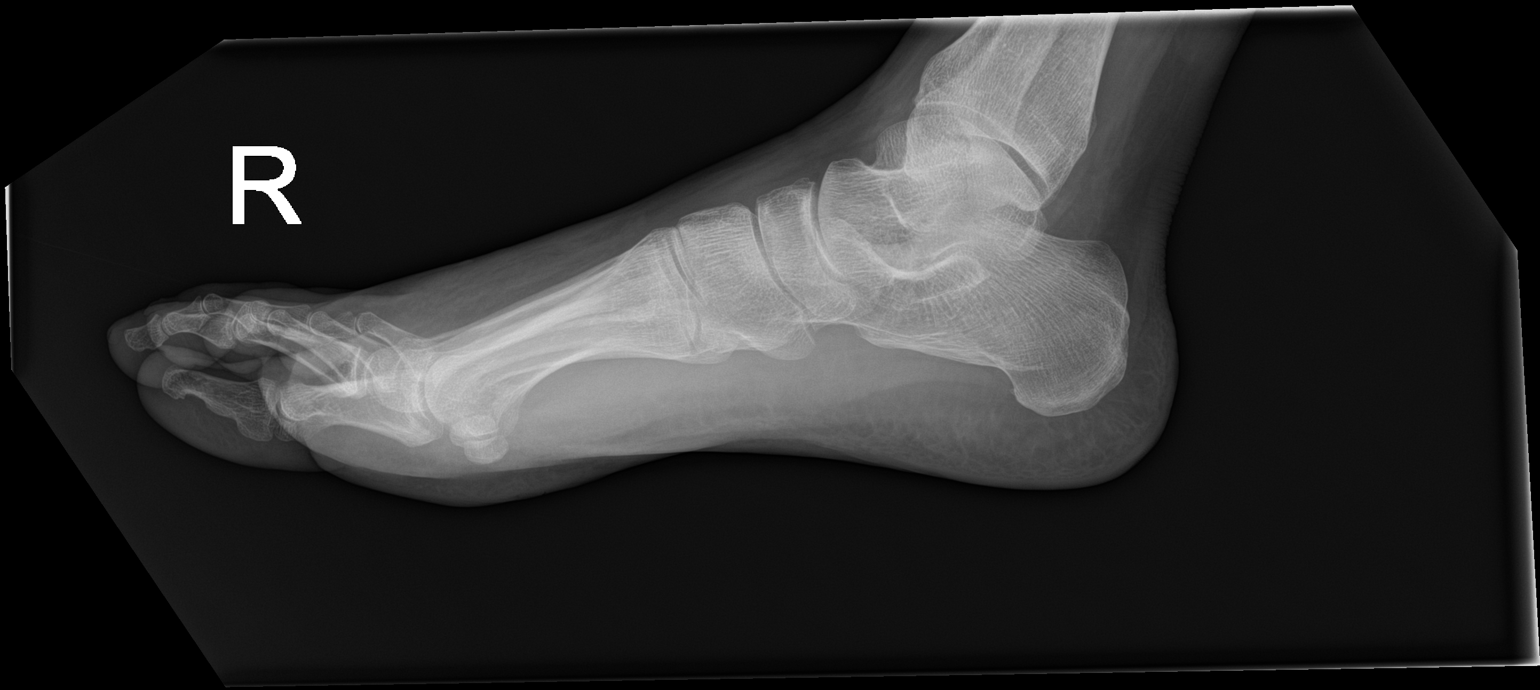

[3 of 3 positions shown; findings below may reference images not displayed]

FINDINGS: There is no evidence of fracture or dislocation. There is no
evidence of arthropathy or other focal bone abnormality. Soft
tissues are unremarkable.
IMPRESSION: Negative.

## 2019-09-22 ENCOUNTER — Other Ambulatory Visit (HOSPITAL_COMMUNITY): Payer: Self-pay | Admitting: Anesthesiology

## 2019-09-22 ENCOUNTER — Other Ambulatory Visit: Payer: Self-pay | Admitting: Anesthesiology

## 2019-09-22 DIAGNOSIS — G90521 Complex regional pain syndrome I of right lower limb: Secondary | ICD-10-CM

## 2019-09-30 ENCOUNTER — Ambulatory Visit (HOSPITAL_COMMUNITY)
Admission: RE | Admit: 2019-09-30 | Discharge: 2019-09-30 | Disposition: A | Discharge status: Home / Self Care | Source: Ambulatory Visit | Attending: Anesthesiology | Admitting: Anesthesiology

## 2019-09-30 ENCOUNTER — Encounter (HOSPITAL_COMMUNITY)
Admission: RE | Admit: 2019-09-30 | Discharge: 2019-09-30 | Disposition: A | Discharge status: Home / Self Care | Source: Ambulatory Visit | Attending: Anesthesiology | Admitting: Anesthesiology

## 2019-09-30 ENCOUNTER — Other Ambulatory Visit: Payer: Self-pay

## 2019-09-30 DIAGNOSIS — G90521 Complex regional pain syndrome I of right lower limb: Secondary | ICD-10-CM

## 2019-09-30 MED ORDER — TECHNETIUM TC 99M MEDRONATE IV KIT
20.0000 | PACK | Freq: Once | INTRAVENOUS | Status: AC | PRN
  Administered 2019-09-30: 20 via INTRAVENOUS

## 2020-04-09 ENCOUNTER — Telehealth: Payer: Self-pay | Admitting: Medical

## 2020-04-09 NOTE — Telephone Encounter (Signed)
Patient came into office to have Dr. Alvira Monday complete long term insurance for guardian. Patient would like to be informed once form is completed and ready to be picked up.

## 2020-04-13 NOTE — Telephone Encounter (Signed)
Called pt to let him know he would need an appt and he stated he will call back.

## 2020-06-09 IMAGING — NM NM BONE 3 PHASE
12 series · 22 of 22 positions shown · non-contrast
Comparison: None

Radiographic correlation: RIGHT foot radiographs 12/05/2018

CLINICAL DATA: RIGHT foot pain between great and second toes since
dropping a heavy object on his foot at work, complex regional pain
syndrome type I of the RIGHT lower extremity

EXAM:
NUCLEAR MEDICINE 3-PHASE BONE SCAN
TECHNIQUE: Radionuclide angiographic images, immediate static blood pool
images, and 3-hour delayed static images were obtained of the
feet/ankles after intravenous injection of radiopharmaceutical.
RADIOPHARMACEUTICALS:  21.2 mCi Oc-KKm MDP IV

[Series 1: flow · 2.07mm/px · 6 of 48 frames shown (1 of 2)]
[frame 5/48]
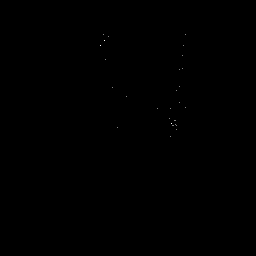
[frame 13/48]
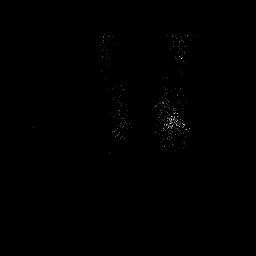
[frame 21/48]
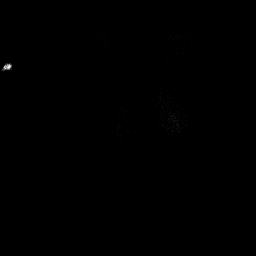
[frame 29/48]
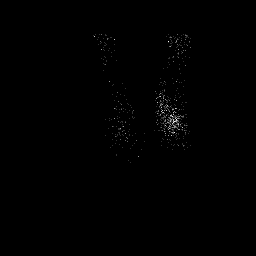
[frame 37/48]
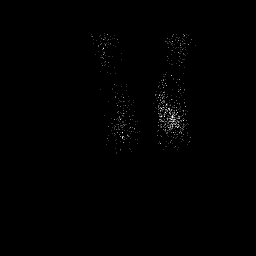
[frame 45/48]
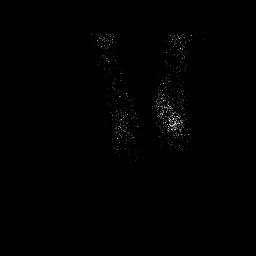

[Series 1: flow · 2.07mm/px · 6 of 48 frames shown (2 of 2)]
[frame 5/48]
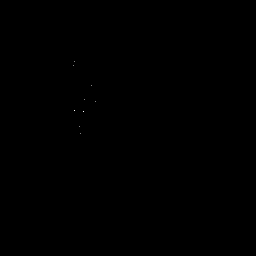
[frame 13/48]
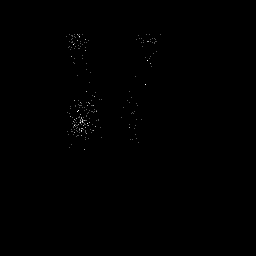
[frame 21/48]
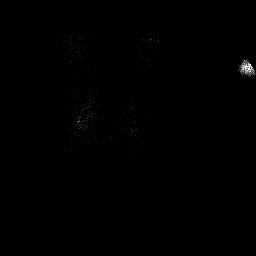
[frame 29/48]
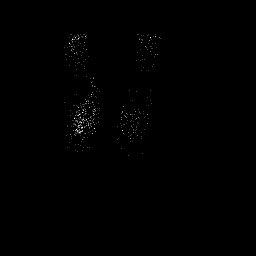
[frame 37/48]
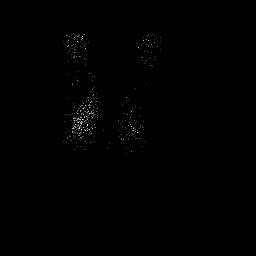
[frame 45/48]
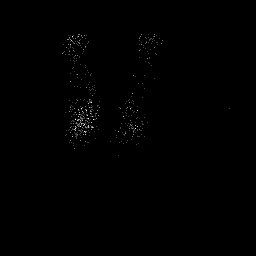

[Series 2: blood pool · 2.07mm/px · 1 of 1 slices shown (1 of 2)]
[im 1/1  full-range]
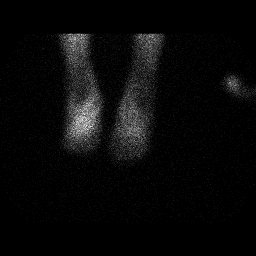

[Series 2: blood pool · 2.07mm/px · 1 of 1 slices shown (2 of 2)]
[im 1/1]
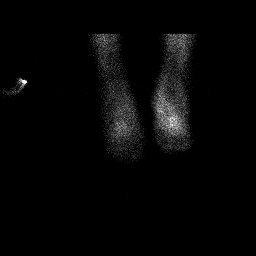

[Series 3: bone statics · 2.07mm/px · 1 of 1 slices shown]
[im 1/1  full-range]
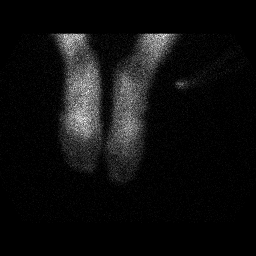

[Series 4: lat bp · 2.07mm/px · 1 of 1 slices shown (1 of 2)]
[im 1/1  full-range]
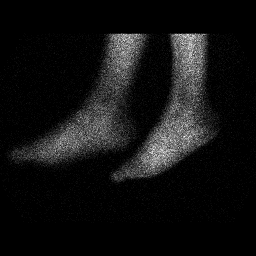

[Series 4: lat bp · 2.07mm/px · 1 of 1 slices shown (2 of 2)]
[im 1/1  full-range]
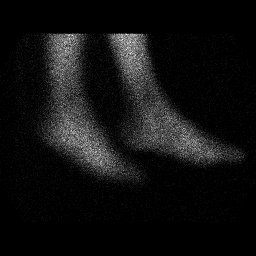

[Series 5: delay · delayed · 2.07mm/px · 1 of 1 slices shown (1 of 4)]
[im 1/1]
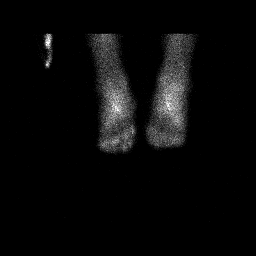

[Series 5: delay · delayed · 2.07mm/px · 1 of 1 slices shown (2 of 4)]
[im 1/1  full-range]
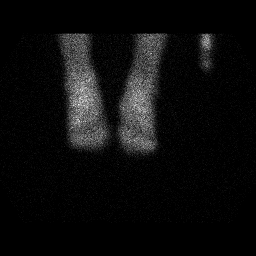

[Series 6: bone statics (2) · 2.07mm/px · 1 of 1 slices shown]
[im 1/1  full-range]
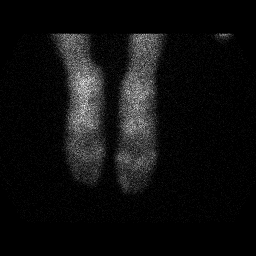

[Series 7: delay · delayed · 2.07mm/px · 1 of 1 slices shown (3 of 4)]
[im 1/1  full-range]
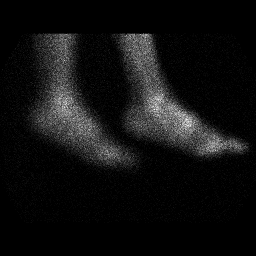

[Series 7: delay · delayed · 2.07mm/px · 1 of 1 slices shown (4 of 4)]
[im 1/1  full-range]
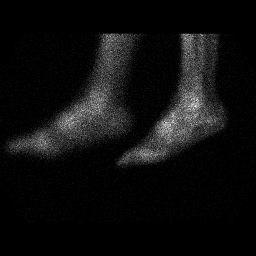

[22 of 22 positions shown; findings below may reference images not displayed]

FINDINGS: Vascular phase: Minimally increased blood flow to LEFT foot versus
RIGHT

Blood pool phase: Minimally increased blood pool within the mid LEFT
midfoot versus RIGHT

Delayed phase: On delayed images, foci of mildly increased uptake of
tracer are seen within the RIGHT toes versus LEFT, appear articular.
No other focal areas of abnormal tracer uptake are seen.
IMPRESSION: Decreased blood flow and blood pool of tracer within the RIGHT foot
as compared to the LEFT.

However, on delayed images, periarticular uptake is identified at
the RIGHT toes, a pattern which is suggestive of complex regional
pain syndrome.

The absence of increased blood flow and blood pool to the affected
extremity however is atypical.

## 2021-05-05 ENCOUNTER — Other Ambulatory Visit (HOSPITAL_BASED_OUTPATIENT_CLINIC_OR_DEPARTMENT_OTHER): Payer: Self-pay

## 2021-05-05 ENCOUNTER — Other Ambulatory Visit: Payer: Self-pay

## 2021-05-05 ENCOUNTER — Emergency Department (HOSPITAL_BASED_OUTPATIENT_CLINIC_OR_DEPARTMENT_OTHER)
Admission: EM | Admit: 2021-05-05 | Discharge: 2021-05-05 | Disposition: A | Discharge status: Home / Self Care | Payer: Commercial Managed Care - PPO | Attending: Emergency Medicine | Admitting: Emergency Medicine

## 2021-05-05 ENCOUNTER — Encounter (HOSPITAL_BASED_OUTPATIENT_CLINIC_OR_DEPARTMENT_OTHER): Payer: Self-pay | Admitting: *Deleted

## 2021-05-05 DIAGNOSIS — Z87891 Personal history of nicotine dependence: Secondary | ICD-10-CM | POA: Insufficient documentation

## 2021-05-05 DIAGNOSIS — R519 Headache, unspecified: Secondary | ICD-10-CM | POA: Diagnosis present

## 2021-05-05 DIAGNOSIS — H9201 Otalgia, right ear: Secondary | ICD-10-CM | POA: Diagnosis not present

## 2021-05-05 DIAGNOSIS — J019 Acute sinusitis, unspecified: Secondary | ICD-10-CM | POA: Diagnosis not present

## 2021-05-05 MED ORDER — AMOXICILLIN-POT CLAVULANATE 875-125 MG PO TABS
1.0000 | ORAL_TABLET | Freq: Two times a day (BID) | ORAL | 0 refills | Status: AC
  Filled 2021-05-05: qty 14, 7d supply, fill #0

## 2021-05-05 NOTE — ED Provider Notes (Signed)
[facility] EMERGENCY DEPARTMENT Provider Note   CSN: [id number] Arrival date & time: 05/05/21  1506     History Chief Complaint  Patient presents with   Headache    Peter Myers is a 50 y.o. male.  HPI   Pt is a 50 y/o male with a h/o allergies, gerd who presents to the ED today for eval of a headache. States that he cut the grass a few days ago and was not wearing a mask and since then he has had sneezing, congestion/rhinorrhea, sinus pressure and a headache.   He has tried taking sinus calm, mucinex, claritin, fluticasone, and tylenol. He is c/o R ear pain as well. Denies fevers, cough, sore throat, vision changes, numbness/weakness. He has had similar headaches in the past and has been diagnosed with a sinus infection and put on antibiotics.   Past Medical History:  Diagnosis Date   Allergy    GERD (gastroesophageal reflux disease)    occasional. Not daily.    Patient Active Problem List   Diagnosis Date Noted   Pain, dental 02/10/2014    Past Surgical History:  Procedure Laterality Date   KNEE SURGERY     KNEE SURGERY     rt knee surgery.        No family history on file.  Social History   Tobacco Use   Smoking status: Former    Packs/day: 0.25    Years: 1.00    Pack years: 0.25    Types: Cigarettes    Quit date: 06/24/2017    Years since quitting: 3.8   Smokeless tobacco: Never  Vaping Use   Vaping Use: Never used  Substance Use Topics   Alcohol use: Yes    Comment: occsional on weekends. 1-2 beers.   Drug use: Not Currently    Home Medications Prior to Admission medications   Medication Sig Start Date End Date Taking? Authorizing Provider  amoxicillin-clavulanate (AUGMENTIN) 875-125 MG tablet Take 1 tablet by mouth every 12 (twelve) hours. 05/05/21  Yes Jamil Armwood S, PA-C  benzonatate (TESSALON) 100 MG capsule Take 1 capsule (100 mg total) by mouth every 8 (eight) hours. 03/21/18   Renne Crigler, PA-C  diclofenac (VOLTAREN)  75 MG EC tablet Take 1 tablet (75 mg total) by mouth 2 (two) times daily. 12/23/18   Saguier, Ramon Dredge, PA-C  fluticasone (FLONASE) 50 MCG/ACT nasal spray Place 2 sprays into both nostrils daily. 06/24/18   Saguier, Ramon Dredge, PA-C  levocetirizine (XYZAL) 5 MG tablet Take 1 tablet (5 mg total) by mouth every evening. 06/24/18   Saguier, Ramon Dredge, PA-C    Allergies    Codeine  Review of Systems   Review of Systems  Constitutional:  Negative for chills and fever.  HENT:  Positive for congestion, rhinorrhea, sinus pressure, sinus pain and sneezing. Negative for ear pain and sore throat.   Eyes:  Positive for photophobia. Negative for pain and visual disturbance.  Respiratory:  Negative for cough and shortness of breath.   Cardiovascular:  Negative for chest pain.  Gastrointestinal:  Negative for abdominal pain, constipation, diarrhea, nausea and vomiting.  Genitourinary:  Negative for dysuria and hematuria.  Musculoskeletal:  Negative for back pain.  Skin:  Negative for rash.  Neurological:  Positive for headaches. Negative for dizziness, weakness, light-headedness and numbness.  All other systems reviewed and are negative.  Physical Exam Updated Vital Signs BP (!) 129/91 (BP Location: Left Arm)   Pulse (!) 101   Temp 98.8 F (37.1 C) (  Oral)   Resp 20   Ht 5\' 8"  (1.727 m)   Wt 84.6 kg   SpO2 98%   BMI 28.37 kg/m   Physical Exam Vitals and nursing note reviewed.  Constitutional:      Appearance: He is well-developed.  HENT:     Head: Normocephalic and atraumatic.     Nose: Congestion present.     Right Turbinates: Enlarged and swollen.     Left Turbinates: Enlarged and swollen.     Right Sinus: Maxillary sinus tenderness present.     Left Sinus: Maxillary sinus tenderness present.  Eyes:     Extraocular Movements: Extraocular movements intact.     Conjunctiva/sclera: Conjunctivae normal.     Pupils: Pupils are equal, round, and reactive to light.  Cardiovascular:     Rate and  Rhythm: Normal rate and regular rhythm.     Heart sounds: No murmur heard. Pulmonary:     Effort: Pulmonary effort is normal. No respiratory distress.     Breath sounds: Normal breath sounds.  Abdominal:     Palpations: Abdomen is soft.     Tenderness: There is no abdominal tenderness.  Musculoskeletal:     Cervical back: Neck supple.  Skin:    General: Skin is warm and dry.  Neurological:     Mental Status: He is alert.     Comments: CN II-XII intact. Strength/sensation to BUE/BLE intact.    ED Results / Procedures / Treatments   Labs (all labs ordered are listed, but only abnormal results are displayed) Labs Reviewed - No data to display  EKG None  Radiology No results found.  Procedures Procedures   Medications Ordered in ED Medications - No data to display  ED Course  I have reviewed the triage vital signs and the nursing notes.  Pertinent labs & imaging results that were available during my care of the patient were reviewed by me and considered in my medical decision making (see chart for details).    MDM Rules/Calculators/A&P                          Patient complaining of symptoms of sinusitis.    Severe symptoms with purulent nasal discharge and maxillary sinus pain.  Concern for acute bacterial rhinosinusitis.  Patient discharged with Augmentin.  Instructions given for warm saline nasal wash, fluticasone, claritin, and recommendations for follow-up with primary care physician.  He declined a covid test. Advised on return precautions. He voices understanding of the plan and reasons to return. All questions answered, pt stable for discharge   Final Clinical Impression(s) / ED Diagnoses Final diagnoses:  Acute sinusitis, recurrence not specified, unspecified location    Rx / DC Orders ED Discharge Orders          Ordered    amoxicillin-clavulanate (AUGMENTIN) 875-125 MG tablet  Every 12 hours        05/05/21 1627             05/07/21,  PA-C 05/05/21 1627    05/07/21, MD 05/06/21 1705

## 2021-05-05 NOTE — ED Triage Notes (Signed)
Pt. Reports he started having a frontal headache after mowing the grass.  Pt. Reports he has sinus issues and nasal drainage with a R ear ache and no drainage from the R ear.

## 2021-05-05 NOTE — Discharge Instructions (Addendum)
You were given a prescription for antibiotics. Please take the antibiotic prescription fully.   Please follow up with your primary care provider within 5-7 days for re-evaluation of your symptoms. If you do not have a primary care provider, information for a healthcare clinic has been provided for you to make arrangements for follow up care. Please return to the emergency department for any new or worsening symptoms.  

## 2021-05-06 ENCOUNTER — Telehealth: Payer: Commercial Managed Care - PPO | Admitting: Family
